# Patient Record
Sex: Female | Born: 1990 | Race: White | Hispanic: No | Marital: Single | Smoking: Never smoker
Health system: Southern US, Community
[De-identification: ages and names within clinical notes are randomized; demographics above are authoritative.]

## PROBLEM LIST (undated history)

## (undated) DIAGNOSIS — F32A Depression, unspecified: Secondary | ICD-10-CM

## (undated) DIAGNOSIS — R162 Hepatomegaly with splenomegaly, not elsewhere classified: Secondary | ICD-10-CM

## (undated) DIAGNOSIS — F419 Anxiety disorder, unspecified: Secondary | ICD-10-CM

## (undated) DIAGNOSIS — F329 Major depressive disorder, single episode, unspecified: Secondary | ICD-10-CM

## (undated) HISTORY — DX: Anxiety disorder, unspecified: F41.9

## (undated) HISTORY — DX: Major depressive disorder, single episode, unspecified: F32.9

## (undated) HISTORY — DX: Hepatomegaly with splenomegaly, not elsewhere classified: R16.2

## (undated) HISTORY — DX: Depression, unspecified: F32.A

---

## 2011-06-22 HISTORY — PX: APPENDECTOMY: SHX54

## 2013-03-21 DIAGNOSIS — R162 Hepatomegaly with splenomegaly, not elsewhere classified: Secondary | ICD-10-CM

## 2013-03-21 DIAGNOSIS — R161 Splenomegaly, not elsewhere classified: Secondary | ICD-10-CM | POA: Insufficient documentation

## 2013-03-21 HISTORY — DX: Hepatomegaly with splenomegaly, not elsewhere classified: R16.2

## 2013-04-16 ENCOUNTER — Ambulatory Visit (INDEPENDENT_AMBULATORY_CARE_PROVIDER_SITE_OTHER): Payer: Managed Care, Other (non HMO) | Admitting: Family Medicine

## 2013-04-16 VITALS — BP 114/83 | HR 102 | Temp 99.0°F | Resp 18 | Ht 66.0 in | Wt 210.0 lb

## 2013-04-16 DIAGNOSIS — R52 Pain, unspecified: Secondary | ICD-10-CM

## 2013-04-16 DIAGNOSIS — R51 Headache: Secondary | ICD-10-CM

## 2013-04-16 DIAGNOSIS — R61 Generalized hyperhidrosis: Secondary | ICD-10-CM

## 2013-04-16 LAB — POCT URINALYSIS DIPSTICK
Nitrite, UA: NEGATIVE
Protein, UA: NEGATIVE
Urobilinogen, UA: 0.2

## 2013-04-16 LAB — POCT CBC
Lymph, poc: 1.9 (ref 0.6–3.4)
MCHC: 32.5 g/dL (ref 31.8–35.4)
MPV: 9 fL (ref 0–99.8)
POC Granulocyte: 2.2 (ref 2–6.9)
POC LYMPH PERCENT: 42.8 %L (ref 10–50)
POC MID %: 8 %M (ref 0–12)
RDW, POC: 13 %

## 2013-04-16 LAB — COMPREHENSIVE METABOLIC PANEL
ALT: 34 U/L (ref 0–35)
CO2: 23 mEq/L (ref 19–32)
Calcium: 9.6 mg/dL (ref 8.4–10.5)
Chloride: 103 mEq/L (ref 96–112)
Creat: 0.87 mg/dL (ref 0.50–1.10)

## 2013-04-16 LAB — POCT UA - MICROSCOPIC ONLY
Casts, Ur, LPF, POC: NEGATIVE
Crystals, Ur, HPF, POC: NEGATIVE
Yeast, UA: NEGATIVE

## 2013-04-16 LAB — TSH: TSH: 4.778 u[IU]/mL — ABNORMAL HIGH (ref 0.350–4.500)

## 2013-04-16 LAB — CK: Total CK: 39 U/L (ref 7–177)

## 2013-04-16 LAB — GLUCOSE, POCT (MANUAL RESULT ENTRY): POC Glucose: 78 mg/dl (ref 70–99)

## 2013-04-16 NOTE — Progress Notes (Addendum)
[address]   [place], [place]  [address]   [phone]  Subjective:    Patient ID: Melanie Crosby, female    DOB: [birth year], 22 y.o.   MRN: [id number]  Chief Complaint  Patient presents with  . Generalized Body Aches    x` 1 week  . Chills   This chart was scribed for Norberto Sorenson, MD by Blanchard Kelch, ED Scribe. The patient was seen in room 14. Patient's care was started at 10:32 AM.   HPI  Melanie Crosby is a 22 y.o. female who presents to office complaining of constant myalgias, worst in shoulders, neck and back that began five days ago. She felt like she was going to become ill with a URI but then she never developed the heent sxs - just persisted with worsening muscle and neck aches and episodes of sweating.  Two days ago she started getting headaches, abdominal pain, lightheaded, chills and became diaphoretic. Her headache yesterday was so severe she was unable to get up or open her eyes without pain - severe photosensitivity. Eventually went away with tylenol.  She has been getting nauseous at night and has lost her appetite since the myalgias began. Abd pain is diffuse, bowels are normal. She has taken her temp at home and highest 99 despite diaphoresis and chills.  She denies fever, chest pain, palpitations, tremors, rash, leg swelling, shortness of breath, cough, rhinorrhea, congestion, vaginal discharge, or changes in urine.   She normally works out using cardio workout videos but has been busy at work in the past week so she has not used them.She may have been around sick contacts at work but she lives alone. She has not traveled recently. Works in a plant.   Past Medical History  Diagnosis Date  . Depression   . Anxiety    No current outpatient prescriptions on file prior to visit.   No current facility-administered medications on file prior to visit.   No allergies on file  Review of Systems  Constitutional: Positive for chills, diaphoresis and appetite change.  Negative for fever.  HENT: Negative for congestion, ear pain, rhinorrhea and sinus pressure.   Eyes: Positive for photophobia.  Respiratory: Negative for cough and shortness of breath.   Cardiovascular: Negative for chest pain, palpitations and leg swelling.  Gastrointestinal: Positive for nausea and abdominal pain. Negative for vomiting, diarrhea, constipation and blood in stool.  Genitourinary: Negative for dysuria, urgency, frequency, hematuria and vaginal discharge.  Musculoskeletal: Positive for back pain, myalgias, neck pain and neck stiffness.  Skin: Negative for rash.  Neurological: Positive for light-headedness and headaches. Negative for tremors.      BP 110/70  Pulse 81  Temp(Src) 99 F (37.2 C) (Oral)  Resp 18  Ht 5\' 6"  (1.676 m)  Wt 210 lb (95.255 kg)  BMI 33.91 kg/m2  SpO2 97%  LMP 04/12/2013 Objective:   Physical Exam  Nursing note and vitals reviewed. Constitutional: She is oriented to person, place, and time. She appears well-developed and well-nourished. No distress.  HENT:  Head: Normocephalic and atraumatic.  Right Ear: Tympanic membrane, external ear and ear canal normal.  Left Ear: Tympanic membrane, external ear and ear canal normal.  Nose: Rhinorrhea present.  Mouth/Throat: Uvula is midline, oropharynx is clear and moist and mucous membranes are normal.  Eyes: EOM are normal.  Neck: Normal range of motion. Neck supple. No tracheal deviation present. No Brudzinski's sign and no Kernig's sign noted. No thyromegaly present.  Cardiovascular: Normal rate, regular  rhythm, S1 normal, S2 normal and normal heart sounds.   No murmur heard. Pulmonary/Chest: Effort normal and breath sounds normal. No respiratory distress. She has no wheezes. She has no rales.  Abdominal: Soft. Bowel sounds are normal. She exhibits no distension. There is tenderness in the right upper quadrant and left upper quadrant. There is no rebound, no guarding, no tenderness at McBurney's point  and negative Murphy's sign.  Question hepatomegaly. Tenderness so severe it caused the patient to tear up during palpation of abdomen.  Musculoskeletal: Normal range of motion.  Lymphadenopathy:    She has cervical adenopathy.       Left cervical: Superficial cervical adenopathy present.       Right: No supraclavicular adenopathy present.       Left: No supraclavicular adenopathy present.  Neurological: She is alert and oriented to person, place, and time.  Reflex Scores:      Patellar reflexes are 2+ on the right side and 2+ on the left side. Skin: Skin is warm. She is diaphoretic.  Psychiatric: She has a normal mood and affect. Her behavior is normal.   Results for orders placed in visit on 04/16/13  POCT CBC      Result Value Range   WBC 4.5 (*) 4.6 - 10.2 K/uL   Lymph, poc 1.9  0.6 - 3.4   POC LYMPH PERCENT 42.8  10 - 50 %L   MID (cbc) 0.4  0 - 0.9   POC MID % 8.0  0 - 12 %M   POC Granulocyte 2.2  2 - 6.9   Granulocyte percent 49.2  37 - 80 %G   RBC 4.55  4.04 - 5.48 M/uL   Hemoglobin 13.6  12.2 - 16.2 g/dL   HCT, POC 16.1  09.6 - 47.9 %   MCV 92.0  80 - 97 fL   MCH, POC 29.9  27 - 31.2 pg   MCHC 32.5  31.8 - 35.4 g/dL   RDW, POC 04.5     Platelet Count, POC 162  142 - 424 K/uL   MPV 9.0  0 - 99.8 fL  GLUCOSE, POCT (MANUAL RESULT ENTRY)      Result Value Range   POC Glucose 78  70 - 99 mg/dl  POCT UA - MICROSCOPIC ONLY      Result Value Range   WBC, Ur, HPF, POC 1-2     RBC, urine, microscopic neg     Bacteria, U Microscopic trace     Mucus, UA neg     Epithelial cells, urine per micros 1-2     Crystals, Ur, HPF, POC neg     Casts, Ur, LPF, POC neg     Yeast, UA neg    POCT URINALYSIS DIPSTICK      Result Value Range   Color, UA bright yellow     Clarity, UA clear     Glucose, UA neg     Bilirubin, UA neg     Ketones, UA neg     Spec Grav, UA 1.020     Blood, UA neg     pH, UA 6.0     Protein, UA neg     Urobilinogen, UA 0.2     Nitrite, UA neg      Leukocytes, UA Trace          Assessment & Plan:  12:01 PM- discussed lab results with patient. Scheduled follow up for 10/29 to discuss labs that have not yet resulted. Recommend symptomatic  treatment of rest and fluids. Patient verbalizes understanding and agrees with treatment plan.  Body aches - Plan: POCT CBC, POCT glucose (manual entry), POCT SEDIMENTATION RATE, POCT UA - Microscopic Only, POCT urinalysis dipstick, TSH, Epstein-Barr virus VCA antibody panel, Comprehensive metabolic panel, CK, CANCELED: POCT glycosylated hemoglobin (Hb A1C)  Headache(784.0)  Night sweat  Unsure of etiology of sxs - suspect viral in origin. Recheck w/ me in 2-3d and advise rest and hygiene during that time.   If any sxs worsen of HA, photophobia, neck stiffness - go to ER immed to r/o meningitis.    I personally performed the services described in this documentation, which was scribed in my presence. The recorded information has been reviewed and considered, and addended by me as needed.  Norberto Sorenson, MD MPH

## 2013-04-17 LAB — EPSTEIN-BARR VIRUS VCA ANTIBODY PANEL
EBV NA IgG: <3 U/mL (ref <18.0)
EBV VCA IgM: 11.8 U/mL (ref <36.0)

## 2013-04-18 ENCOUNTER — Ambulatory Visit (INDEPENDENT_AMBULATORY_CARE_PROVIDER_SITE_OTHER): Payer: Managed Care, Other (non HMO) | Admitting: Family Medicine

## 2013-04-18 VITALS — BP 121/82 | HR 92 | Temp 98.4°F | Resp 16 | Ht 65.75 in | Wt 209.2 lb

## 2013-04-18 DIAGNOSIS — R1011 Right upper quadrant pain: Secondary | ICD-10-CM

## 2013-04-18 LAB — POCT CBC
Granulocyte percent: 55.4 %G (ref 37–80)
HCT, POC: 38 % (ref 37.7–47.9)
MCV: 92.4 fL (ref 80–97)
MPV: 8.4 fL (ref 0–99.8)
POC Granulocyte: 3.9 (ref 2–6.9)
POC LYMPH PERCENT: 39.7 %L (ref 10–50)
POC MID %: 4.9 %M (ref 0–12)
RBC: 4.11 M/uL (ref 4.04–5.48)
RDW, POC: 13.3 %

## 2013-04-18 LAB — POCT URINALYSIS DIPSTICK
Bilirubin, UA: NEGATIVE
Blood, UA: NEGATIVE
Glucose, UA: NEGATIVE
Nitrite, UA: NEGATIVE
Spec Grav, UA: 1.02
Urobilinogen, UA: 0.2

## 2013-04-18 LAB — POCT SEDIMENTATION RATE: POCT SED RATE: 32 mm/hr — AB (ref 0–22)

## 2013-04-18 NOTE — Progress Notes (Signed)
This chart was scribed for Norberto Sorenson, MD by Joaquin Music, ED Scribe. This patient was seen in room Room 10 and the patient's care was started at 5:47 PM  Subjective:    Patient ID: Melanie Crosby, female    DOB: Jul 24, 1990, 22 y.o.   MRN: [id number] Chief Complaint  Patient presents with  . Follow-up    body aches, chills    HPI Melanie Crosby is a 22 y.o. female who presents to the [facility] for F/U visit from 04/16/2013 when she was seen for diffuse myalgias, diaphoresis, and chills. Pt states she has been feeling better but states she has developed a little sore throat. She states she also has R sided abd pain. She states she had a HA but states the HA was not as bad as before. She denies photophobia. She states she has been having myalgias in the neck, shoulders, and throughout her body. She states she felt slightly lightheaded earlier today but states she believes that was due to standing up too fast and passed after just a few seconds. Has been sleeping a little more than normal but energy levels are starting to return. Mother states pt is very "moopy and has low energy levels." Pt states her diaphoresis episodes are "not as bad."   Pt denies fever, diarrhea, skin rashes and vaginal discharge.  Discussed lab findings from last visit with pt and mother of pt. Pt does not have mononucleosis. Pts liver was normal. Pts thyroid level was low (TSH was mildly elev). Reccommended pt to get thyroid re-checked in 2-4 wks after illness.  Mother flew in to be w/ pt during her illness - mother has MS and is on immunosuppressive medication. Pt works in a [employer] and mother is concerned this could be from some chemical exposure though pt denies.  Past Medical History  Diagnosis Date  . Depression   . Anxiety    No current outpatient prescriptions on file prior to visit.   No current facility-administered medications on file prior to visit.   No Known Allergies   Review of Systems   Constitutional: Positive for chills, diaphoresis, activity change, appetite change and fatigue. Negative for fever.  HENT: Positive for sore throat. Negative for congestion, ear discharge, ear pain, mouth sores, nosebleeds, postnasal drip, rhinorrhea, sinus pressure, sneezing, trouble swallowing and voice change.   Eyes: Positive for photophobia. Negative for pain.  Respiratory: Negative for cough and shortness of breath.   Cardiovascular: Negative for chest pain.  Gastrointestinal: Positive for nausea. Negative for vomiting, abdominal pain, diarrhea and constipation.  Genitourinary: Negative for dysuria, frequency, decreased urine volume and vaginal discharge.  Musculoskeletal: Positive for myalgias, neck pain and neck stiffness. Negative for arthralgias, gait problem and joint swelling.  Skin: Negative for rash.  Neurological: Positive for weakness, light-headedness and headaches. Negative for dizziness and syncope.  Hematological: Positive for adenopathy.  Psychiatric/Behavioral: Positive for sleep disturbance.   Triage Vitals:BP 108/74  Pulse 60  Temp(Src) 98.4 F (36.9 C) (Oral)  Resp 16  Ht 5' 5.75" (1.67 m)  Wt 209 lb 3.2 oz (94.892 kg)  BMI 34.02 kg/m2  SpO2 100%  LMP 04/12/2013    Objective:   Physical Exam  Nursing note and vitals reviewed. Constitutional: She is oriented to person, place, and time. She appears well-developed and well-nourished. No distress.  HENT:  Head: Normocephalic and atraumatic.  Right Ear: External ear normal.  Left Ear: External ear normal.  Nose: Nose normal.  Mouth/Throat: Oropharynx is clear and moist.  No oropharyngeal exudate.  Eyes: Conjunctivae are normal. Pupils are equal, round, and reactive to light.  Neck: Normal range of motion. Neck supple. No thyromegaly present.  Tender swollen superficial adenopathy bilaterally, R worse then L. Mild posterior cervical adenopathy.  Cardiovascular: Normal rate, regular rhythm and normal heart  sounds.   No murmur heard. Pulmonary/Chest: Effort normal and breath sounds normal. She has no wheezes.  Abdominal: Soft. Bowel sounds are normal. She exhibits no distension and no mass. There is hepatomegaly. There is generalized tenderness. There is no rebound, no guarding, no CVA tenderness and negative Murphy's sign. No hernia.  Tenderness most in RUQ but is diffuse  Musculoskeletal: Normal range of motion. She exhibits no tenderness.  Lymphadenopathy:       Head (right side): No submandibular, no tonsillar, no preauricular and no posterior auricular adenopathy present.       Head (left side): No submandibular, no tonsillar, no preauricular and no posterior auricular adenopathy present.    She has cervical adenopathy.       Right cervical: Superficial cervical and posterior cervical adenopathy present.       Left cervical: Superficial cervical and posterior cervical adenopathy present.       Right: No supraclavicular adenopathy present.       Left: No supraclavicular adenopathy present.  Neurological: She is alert and oriented to person, place, and time. She has normal reflexes.  Skin: Skin is warm and dry. No rash noted. She is not diaphoretic.  Psychiatric: She has a normal mood and affect. Her behavior is normal.    Results for orders placed in visit on 04/18/13  POCT CBC      Result Value Range   WBC 7.0  4.6 - 10.2 K/uL   Lymph, poc 2.8  0.6 - 3.4   POC LYMPH PERCENT 39.7  10 - 50 %L   MID (cbc) 0.3  0 - 0.9   POC MID % 4.9  0 - 12 %M   POC Granulocyte 3.9  2 - 6.9   Granulocyte percent 55.4  37 - 80 %G   RBC 4.11  4.04 - 5.48 M/uL   Hemoglobin 12.0 (*) 12.2 - 16.2 g/dL   HCT, POC 11.9  14.7 - 47.9 %   MCV 92.4  80 - 97 fL   MCH, POC 29.2  27 - 31.2 pg   MCHC 31.6 (*) 31.8 - 35.4 g/dL   RDW, POC 82.9     Platelet Count, POC 200  142 - 424 K/uL   MPV 8.4  0 - 99.8 fL  POCT URINALYSIS DIPSTICK      Result Value Range   Color, UA yellow     Clarity, UA clear      Glucose, UA neg     Bilirubin, UA trace     Ketones, UA 1.020     Spec Grav, UA 1.020     Blood, UA neg     pH, UA 5.0     Protein, UA neg     Urobilinogen, UA 0.2     Nitrite, UA neg     Leukocytes, UA Trace     6:43 PM-Discussed lab findings from this visit and prior visit. Informed pt she will have a RUQ X-Ray. Informed pt all her labs results are within normal limits. Mother states she has MS and has been concerned about the health of her daughter. Mother states she has been doing an excessive amount of cleaning.  Assessment & Plan:  Abdominal pain, right upper quadrant - Plan: POCT CBC, POCT SEDIMENTATION RATE, POCT urinalysis dipstick, TSH, Cytomegalovirus antibody, IgG, US Abdomen Limited RUQ, Comprehensive metabolic panel, US Abdomen Complete I suspect that pt has an unknown viral illness that is causing some hepatomegaly and liver tenderness. Will order stat RUQ [place] for tomorrow morning and touch base w/ pt again tomorrow after [place] and blood results have returned.  Ok to RTW as fatigue and sxs allow and but recommend hand hygeine - don't cough/sneeze on anyone. No orders of the defined types were placed in this encounter.    I personally performed the services described in this documentation, which was scribed in my presence. The recorded information has been reviewed and considered, and addended by me as needed.  Norberto Sorenson, MD MPH  Addendum: US revealed hepatomegaly and splenomegaly with RUQ tenderness but no [place] Murphy's sign or gallstones. Poss fatty liver. LFTs continued to be nml w/ nml cbc and decreasing ESR. Advised pt to follow clinically - seems like she is improving from whatever this was - likely unknown virus. Rec repeat abd [place] in 4-6 wks to ensure organomegaly has resolved. RTC is sxs cont or worsen.

## 2013-04-19 ENCOUNTER — Ambulatory Visit
Admission: RE | Admit: 2013-04-19 | Discharge: 2013-04-19 | Disposition: A | Discharge status: Home / Self Care | Payer: Managed Care, Other (non HMO) | Source: Ambulatory Visit | Attending: Family Medicine | Admitting: Family Medicine

## 2013-04-19 ENCOUNTER — Telehealth: Payer: Self-pay

## 2013-04-19 ENCOUNTER — Other Ambulatory Visit: Payer: Self-pay

## 2013-04-19 DIAGNOSIS — R1011 Right upper quadrant pain: Secondary | ICD-10-CM

## 2013-04-19 DIAGNOSIS — B349 Viral infection, unspecified: Secondary | ICD-10-CM

## 2013-04-19 DIAGNOSIS — K760 Fatty (change of) liver, not elsewhere classified: Secondary | ICD-10-CM

## 2013-04-19 DIAGNOSIS — R162 Hepatomegaly with splenomegaly, not elsewhere classified: Secondary | ICD-10-CM

## 2013-04-19 LAB — COMPREHENSIVE METABOLIC PANEL
ALT: 34 U/L (ref 0–35)
AST: 34 U/L (ref 0–37)
Calcium: 9.4 mg/dL (ref 8.4–10.5)
Chloride: 104 mEq/L (ref 96–112)
Creat: 0.81 mg/dL (ref 0.50–1.10)
Sodium: 139 mEq/L (ref 135–145)
Total Bilirubin: 0.3 mg/dL (ref 0.3–1.2)
Total Protein: 7.4 g/dL (ref 6.0–8.3)

## 2013-04-19 LAB — TSH: TSH: 2.118 u[IU]/mL (ref 0.350–4.500)

## 2013-04-19 NOTE — Telephone Encounter (Signed)
Patient is calling amy back please call [phone]

## 2013-04-19 NOTE — Telephone Encounter (Signed)
Had called about the [place], this is done. IMPRESSION:  1. No gallstones. No pain over the gallbladder is seen although  there is tenderness over the right upper quadrant.  2. Hepatosplenomegaly.  3. Slightly inhomogeneous liver. Question mild fatty infiltration.  Correlate with liver function tests.  4. Portions of the pancreas are obscured by bowel gas.

## 2013-04-21 LAB — CYTOMEGALOVIRUS ANTIBODY, IGG: Cytomegalovirus Ab-IgG: 0.36 U/mL (ref <0.60)

## 2013-04-23 ENCOUNTER — Telehealth: Payer: Self-pay

## 2013-04-23 NOTE — Telephone Encounter (Signed)
PT STATES HER JOB NEED TO HAVE A NOTE STATING WHAT SHE CAN AND CANNOT DO AND IF ON MODIFIED DUTY. YOU MAY REACH PT AT [phone] AND PLEASE CALL [phone] AND ASK FOR Loletha Carrow

## 2013-04-23 NOTE — Telephone Encounter (Signed)
Called pt w/ [place] results on 10/30 afternoon.  Suspect viral induced hepato- and splenomegaly.  Pt symptomatically improving and LFTs con to be normal so cont w/ watchful waiting and recheck abd [place] in 4-6 wks.

## 2013-04-23 NOTE — Telephone Encounter (Signed)
Please advise on the patients work restrictions.

## 2013-04-24 NOTE — Telephone Encounter (Signed)
Left message for her to call me back. 

## 2013-04-24 NOTE — Telephone Encounter (Signed)
Per my OV note "Ok to RTW as fatigue and sxs allow and but recommend hand hygeine - don't cough/sneeze on anyone."  I guess no heavy lifting over 25 lbs.  No contact sports.  If pt is still feeling fatigue and wants her hours limited to 4 or 6 hrs/day this week, that is fine with me.

## 2013-04-26 NOTE — Telephone Encounter (Signed)
Called again numbers given are not correct.

## 2013-05-25 ENCOUNTER — Ambulatory Visit
Admission: RE | Admit: 2013-05-25 | Discharge: 2013-05-25 | Disposition: A | Discharge status: Home / Self Care | Payer: Managed Care, Other (non HMO) | Source: Ambulatory Visit | Attending: Family Medicine | Admitting: Family Medicine

## 2013-05-25 DIAGNOSIS — K760 Fatty (change of) liver, not elsewhere classified: Secondary | ICD-10-CM

## 2013-05-25 DIAGNOSIS — B349 Viral infection, unspecified: Secondary | ICD-10-CM

## 2013-05-25 DIAGNOSIS — R162 Hepatomegaly with splenomegaly, not elsewhere classified: Secondary | ICD-10-CM

## 2013-05-28 ENCOUNTER — Other Ambulatory Visit: Payer: Self-pay | Admitting: Family Medicine

## 2013-05-28 DIAGNOSIS — R160 Hepatomegaly, not elsewhere classified: Secondary | ICD-10-CM

## 2013-05-29 ENCOUNTER — Encounter: Payer: Self-pay | Admitting: Internal Medicine

## 2013-06-01 ENCOUNTER — Telehealth: Payer: Self-pay

## 2013-06-01 NOTE — Telephone Encounter (Signed)
Left message for patient. Request received and records sent to Dr Dulce Sellar at [facility]

## 2013-06-01 NOTE — Telephone Encounter (Signed)
Patient is calling to see if we received her release form she faxed it yesterday please call [phone]

## 2013-06-26 ENCOUNTER — Ambulatory Visit: Payer: Managed Care, Other (non HMO) | Admitting: Physician Assistant

## 2013-06-26 VITALS — BP 118/70 | HR 106 | Temp 100.5°F | Resp 16 | Ht 66.5 in | Wt 212.6 lb

## 2013-06-26 DIAGNOSIS — J111 Influenza due to unidentified influenza virus with other respiratory manifestations: Secondary | ICD-10-CM

## 2013-06-26 DIAGNOSIS — R059 Cough, unspecified: Secondary | ICD-10-CM

## 2013-06-26 DIAGNOSIS — J101 Influenza due to other identified influenza virus with other respiratory manifestations: Secondary | ICD-10-CM

## 2013-06-26 DIAGNOSIS — R509 Fever, unspecified: Secondary | ICD-10-CM

## 2013-06-26 DIAGNOSIS — K7689 Other specified diseases of liver: Secondary | ICD-10-CM

## 2013-06-26 DIAGNOSIS — R162 Hepatomegaly with splenomegaly, not elsewhere classified: Secondary | ICD-10-CM

## 2013-06-26 DIAGNOSIS — R05 Cough: Secondary | ICD-10-CM

## 2013-06-26 LAB — POCT CBC
GRANULOCYTE PERCENT: 78.2 % (ref 37–80)
HEMATOCRIT: 42.1 % (ref 37.7–47.9)
HEMOGLOBIN: 13.2 g/dL (ref 12.2–16.2)
Lymph, poc: 0.8 (ref 0.6–3.4)
MCH: 28.8 pg (ref 27–31.2)
MCHC: 31.4 g/dL — AB (ref 31.8–35.4)
MCV: 91.7 fL (ref 80–97)
MID (CBC): 0.6 (ref 0–0.9)
MPV: 9.4 fL (ref 0–99.8)
POC Granulocyte: 5 (ref 2–6.9)
POC LYMPH PERCENT: 13.1 %L (ref 10–50)
POC MID %: 8.7 %M (ref 0–12)
Platelet Count, POC: 181 10*3/uL (ref 142–424)
RBC: 4.59 M/uL (ref 4.04–5.48)
RDW, POC: 13.2 %
WBC: 6.4 10*3/uL (ref 4.6–10.2)

## 2013-06-26 LAB — POCT INFLUENZA A/B
INFLUENZA B, POC: NEGATIVE
Influenza A, POC: POSITIVE

## 2013-06-26 MED ORDER — BENZONATATE 100 MG PO CAPS: 100.0000 mg | ORAL_CAPSULE | Freq: Three times a day (TID) | ORAL | Status: DC | PRN

## 2013-06-26 MED ORDER — OSELTAMIVIR PHOSPHATE 75 MG PO CAPS: 75.0000 mg | ORAL_CAPSULE | Freq: Two times a day (BID) | ORAL | Status: DC

## 2013-06-26 MED ORDER — HYDROCODONE-HOMATROPINE 5-1.5 MG/5ML PO SYRP: 5.0000 mL | ORAL_SOLUTION | Freq: Three times a day (TID) | ORAL | Status: DC | PRN

## 2013-06-26 NOTE — Progress Notes (Signed)
Subjective:    Patient ID: Darrick PennaKayla Drouillard, female    DOB: 1991/03/23, 23 y.o.   MRN: [id number]  HPI   [name] is a very pleasant 23 yr old female here with concern for illness.  "My body feels like it got hit by a bus."  Feels sore all over.  Really hot then really cold.  Tmax 100.84F.  "Coughing is awful."  Symptoms began about 48 hours ago.  +sick coworkers.  No flu shot this year.  Of note pt with hepatosplenomegaly first noted her in Oct 2014.  LFTs normal.  U/S with HSM but otherwise normal.  Pt saw a GI doc (not the one she was referred to) and was told she's "chasing something that's not there."  She continues to have intermittent RUQ and occ LUQ pain.  No nausea, vomiting, diarrhea.  GI recommended repeat U/S in April 2014   Review of Systems  Constitutional: Positive for fever and chills.  HENT: Negative for congestion and sore throat.   Respiratory: Positive for cough. Negative for shortness of breath and wheezing.   Cardiovascular: Negative.   Gastrointestinal: Positive for abdominal pain (RUQ, LUQ). Negative for nausea, vomiting and diarrhea.  Musculoskeletal: Positive for arthralgias and myalgias.  Skin: Negative.   Neurological: Negative.        Objective:   Physical Exam  Vitals reviewed. Constitutional: She is oriented to person, place, and time. She appears well-developed and well-nourished. No distress.  HENT:  Head: Normocephalic and atraumatic.  Right Ear: Tympanic membrane and ear canal normal.  Left Ear: Tympanic membrane and ear canal normal.  Mouth/Throat: Uvula is midline, oropharynx is clear and moist and mucous membranes are normal.  Eyes: Conjunctivae are normal. No scleral icterus.  Neck: Neck supple.  Cardiovascular: Normal rate, regular rhythm and normal heart sounds.   Pulmonary/Chest: Effort normal and breath sounds normal. She has no wheezes. She has no rales.  Abdominal: Soft. Bowel sounds are normal. Hepatomegaly: ? difficult to appreciate  due to guarding. There is tenderness in the right upper quadrant and left upper quadrant. There is guarding. There is no rigidity and no rebound.  Lymphadenopathy:    She has cervical adenopathy.  Neurological: She is alert and oriented to person, place, and time.  Skin: Skin is warm and dry.  Psychiatric: She has a normal mood and affect. Her behavior is normal.    Results for orders placed in visit on 06/26/13  POCT CBC      Result Value Range   WBC 6.4  4.6 - 10.2 K/uL   Lymph, poc 0.8  0.6 - 3.4   POC LYMPH PERCENT 13.1  10 - 50 %L   MID (cbc) 0.6  0 - 0.9   POC MID % 8.7  0 - 12 %M   POC Granulocyte 5.0  2 - 6.9   Granulocyte percent 78.2  37 - 80 %G   RBC 4.59  4.04 - 5.48 M/uL   Hemoglobin 13.2  12.2 - 16.2 g/dL   HCT, POC 42.542.1  95.637.7 - 47.9 %   MCV 91.7  80 - 97 fL   MCH, POC 28.8  27 - 31.2 pg   MCHC 31.4 (*) 31.8 - 35.4 g/dL   RDW, POC 38.713.2     Platelet Count, POC 181  142 - 424 K/uL   MPV 9.4  0 - 99.8 fL  POCT INFLUENZA A/B      Result Value Range   Influenza A, POC Positive  Influenza B, POC Negative         Assessment & Plan:  Influenza A - Plan: oseltamivir (TAMIFLU) 75 MG capsule  Fever, unspecified - Plan: POCT CBC, POCT Influenza A/B  Cough - Plan: POCT CBC, POCT Influenza A/B, HYDROcodone-homatropine (HYCODAN) 5-1.5 MG/5ML syrup, benzonatate (TESSALON) 100 MG capsule  Hepatosplenomegaly - Plan: Comprehensive metabolic panel   Ms. Holaday is a very pleasant 23 yr old female here with influenza A.  Symptoms started about 48 hours ago, so may still get some benefit from tamiflu.  Tessalon and Hycodan for cough.  Push fluids, rest.  OOW until fever free.  Pt still with questionable HSM on exam though difficult to appreciate due to guarding.  Pt admits that she is guarding because she is afraid of potential discomfort.  She has no concerning symptoms such as NVD, jaundice, wt loss, etc.  Last LFTs were normal.  Will recheck today.  Agree with GI rec to  repeat U/S in April.  Pt may be interested in ref to another GI as she did not have a pleasant experience with the person she previously saw  E. Frances Furbish MHS, PA-C [facility] 1/6/20155:42 PM

## 2013-06-26 NOTE — Patient Instructions (Signed)
Begin taking the tamiflu tonight.  Be sure to finish the full course.  Use the Tessalon Perles for cough during the day.  Hycodan syrup for cough at night - will make you sleepy!  Drink plenty of fluids and get plenty of rest.  Use Advil and/or Tylenol for fever relief, body aches  Stay out of work until you are fever-free for 24 hours without medication  Flu shot next year :)  I will let you know when your metabolic panel is back.   Influenza, Adult Influenza ("the flu") is a viral infection of the respiratory tract. It occurs more often in winter months because people spend more time in close contact with one another. Influenza can make you feel very sick. Influenza easily spreads from person to person (contagious). CAUSES  Influenza is caused by a virus that infects the respiratory tract. You can catch the virus by breathing in droplets from an infected person's cough or sneeze. You can also catch the virus by touching something that was recently contaminated with the virus and then touching your mouth, nose, or eyes. SYMPTOMS  Symptoms typically last 4 to 10 days and may include:  Fever.  Chills.  Headache, body aches, and muscle aches.  Sore throat.  Chest discomfort and cough.  Poor appetite.  Weakness or feeling tired.  Dizziness.  Nausea or vomiting. DIAGNOSIS  Diagnosis of influenza is often made based on your history and a physical exam. A nose or throat swab test can be done to confirm the diagnosis. RISKS AND COMPLICATIONS You may be at risk for a more severe case of influenza if you smoke cigarettes, have diabetes, have chronic heart disease (such as heart failure) or lung disease (such as asthma), or if you have a weakened immune system. Elderly people and pregnant women are also at risk for more serious infections. The most common complication of influenza is a lung infection (pneumonia). Sometimes, this complication can require emergency medical care and may be  life-threatening. PREVENTION  An annual influenza vaccination (flu shot) is the best way to avoid getting influenza. An annual flu shot is now routinely recommended for all adults in the [place] TREATMENT  In mild cases, influenza goes away on its own. Treatment is directed at relieving symptoms. For more severe cases, your caregiver may prescribe antiviral medicines to shorten the sickness. Antibiotic medicines are not effective, because the infection is caused by a virus, not by bacteria. HOME CARE INSTRUCTIONS  Only take over-the-counter or prescription medicines for pain, discomfort, or fever as directed by your caregiver.  Use a cool mist humidifier to make breathing easier.  Get plenty of rest until your temperature returns to normal. This usually takes 3 to 4 days.  Drink enough fluids to keep your urine clear or pale yellow.  Cover your mouth and nose when coughing or sneezing, and wash your hands well to avoid spreading the virus.  Stay home from work or school until your fever has been gone for at least 1 full day. SEEK MEDICAL CARE IF:   You have chest pain or a deep cough that worsens or produces more mucus.  You have nausea, vomiting, or diarrhea. SEEK IMMEDIATE MEDICAL CARE IF:   You have difficulty breathing, shortness of breath, or your skin or nails turn bluish.  You have severe neck pain or stiffness.  You have a severe headache, facial pain, or earache.  You have a worsening or recurring fever.  You have nausea or vomiting that cannot  be controlled. MAKE SURE YOU:  Understand these instructions.  Will watch your condition.  Will get help right away if you are not doing well or get worse. Document Released: 06/04/2000 Document Revised: 12/07/2011 Document Reviewed: 09/06/2011 [facility] Patient Information 2014 [employer], [place].

## 2013-06-27 ENCOUNTER — Ambulatory Visit: Payer: Managed Care, Other (non HMO) | Admitting: Internal Medicine

## 2013-06-27 LAB — COMPREHENSIVE METABOLIC PANEL
ALK PHOS: 58 U/L (ref 39–117)
ALT: 14 U/L (ref 0–35)
AST: 20 U/L (ref 0–37)
Albumin: 4.6 g/dL (ref 3.5–5.2)
BILIRUBIN TOTAL: 0.3 mg/dL (ref 0.3–1.2)
BUN: 11 mg/dL (ref 6–23)
CO2: 23 mEq/L (ref 19–32)
CREATININE: 0.7 mg/dL (ref 0.50–1.10)
Calcium: 9.3 mg/dL (ref 8.4–10.5)
Chloride: 102 mEq/L (ref 96–112)
GLUCOSE: 84 mg/dL (ref 70–99)
Potassium: 4.1 mEq/L (ref 3.5–5.3)
Sodium: 136 mEq/L (ref 135–145)
TOTAL PROTEIN: 7.9 g/dL (ref 6.0–8.3)

## 2013-07-03 ENCOUNTER — Telehealth: Payer: Self-pay | Admitting: *Deleted

## 2013-07-03 NOTE — Telephone Encounter (Signed)
Tinika from [facility] called to obtain last office visit notes including US report.  Please fax to [phone]

## 2013-07-09 DIAGNOSIS — E669 Obesity, unspecified: Secondary | ICD-10-CM | POA: Insufficient documentation

## 2013-07-09 DIAGNOSIS — F329 Major depressive disorder, single episode, unspecified: Secondary | ICD-10-CM | POA: Insufficient documentation

## 2013-07-09 DIAGNOSIS — F32A Depression, unspecified: Secondary | ICD-10-CM | POA: Insufficient documentation

## 2013-08-28 ENCOUNTER — Other Ambulatory Visit: Payer: Self-pay | Admitting: Gastroenterology

## 2013-08-28 DIAGNOSIS — R160 Hepatomegaly, not elsewhere classified: Secondary | ICD-10-CM

## 2013-10-01 ENCOUNTER — Ambulatory Visit
Admission: RE | Admit: 2013-10-01 | Discharge: 2013-10-01 | Disposition: A | Discharge status: Home / Self Care | Payer: Managed Care, Other (non HMO) | Source: Ambulatory Visit | Attending: Gastroenterology | Admitting: Gastroenterology

## 2013-10-01 DIAGNOSIS — R160 Hepatomegaly, not elsewhere classified: Secondary | ICD-10-CM

## 2013-10-03 ENCOUNTER — Telehealth: Payer: Self-pay | Admitting: Oncology

## 2013-10-03 NOTE — Telephone Encounter (Signed)
S/W PATIENT AND GAVE NEW PATIENT APPT FOR 04/29 @ 10:30 W/DR. SHADAD.  REFERRING DR. Chrissie NoaWILLIAM OUTLAW DX- HEPATOSPLENOMEGALEY-UNCLEAR CAUSE WELCOME PACKET MAILED.

## 2013-10-04 ENCOUNTER — Telehealth: Payer: Self-pay | Admitting: Oncology

## 2013-10-04 NOTE — Telephone Encounter (Signed)
C/D 10/04/13 for appt.10/23/13

## 2013-10-19 ENCOUNTER — Other Ambulatory Visit: Payer: Self-pay | Admitting: Oncology

## 2013-10-19 DIAGNOSIS — R160 Hepatomegaly, not elsewhere classified: Secondary | ICD-10-CM

## 2013-10-22 ENCOUNTER — Telehealth: Payer: Self-pay | Admitting: Medical Oncology

## 2013-10-22 NOTE — Telephone Encounter (Signed)
LVMOM with patient regarding tomorrow's appt. Informed patient of free valet parking and requested pt bring a list of her current medications. Requested patient return call to office to confirm appt.  

## 2013-10-23 ENCOUNTER — Other Ambulatory Visit (HOSPITAL_BASED_OUTPATIENT_CLINIC_OR_DEPARTMENT_OTHER): Payer: Managed Care, Other (non HMO)

## 2013-10-23 ENCOUNTER — Ambulatory Visit (HOSPITAL_BASED_OUTPATIENT_CLINIC_OR_DEPARTMENT_OTHER): Payer: Managed Care, Other (non HMO) | Admitting: Oncology

## 2013-10-23 ENCOUNTER — Ambulatory Visit: Payer: Managed Care, Other (non HMO)

## 2013-10-23 ENCOUNTER — Encounter: Payer: Self-pay | Admitting: Oncology

## 2013-10-23 ENCOUNTER — Encounter (INDEPENDENT_AMBULATORY_CARE_PROVIDER_SITE_OTHER): Payer: Self-pay

## 2013-10-23 VITALS — BP 130/69 | HR 77 | Temp 98.2°F | Resp 20 | Ht 66.5 in | Wt 203.1 lb

## 2013-10-23 DIAGNOSIS — K7689 Other specified diseases of liver: Secondary | ICD-10-CM

## 2013-10-23 DIAGNOSIS — R161 Splenomegaly, not elsewhere classified: Secondary | ICD-10-CM

## 2013-10-23 DIAGNOSIS — R160 Hepatomegaly, not elsewhere classified: Secondary | ICD-10-CM

## 2013-10-23 LAB — CBC WITH DIFFERENTIAL/PLATELET
BASO%: 0.3 % (ref 0.0–2.0)
BASOS ABS: 0 10*3/uL (ref 0.0–0.1)
EOS%: 0.6 % (ref 0.0–7.0)
Eosinophils Absolute: 0 10*3/uL (ref 0.0–0.5)
HEMATOCRIT: 38.6 % (ref 34.8–46.6)
HEMOGLOBIN: 12.9 g/dL (ref 11.6–15.9)
LYMPH#: 1.5 10*3/uL (ref 0.9–3.3)
LYMPH%: 19.5 % (ref 14.0–49.7)
MCH: 29.3 pg (ref 25.1–34.0)
MCHC: 33.3 g/dL (ref 31.5–36.0)
MCV: 88 fL (ref 79.5–101.0)
MONO#: 0.5 10*3/uL (ref 0.1–0.9)
MONO%: 6.7 % (ref 0.0–14.0)
NEUT%: 72.9 % (ref 38.4–76.8)
NEUTROS ABS: 5.6 10*3/uL (ref 1.5–6.5)
Platelets: 212 10*3/uL (ref 145–400)
RBC: 4.38 10*6/uL (ref 3.70–5.45)
RDW: 13 % (ref 11.2–14.5)
WBC: 7.7 10*3/uL (ref 3.9–10.3)

## 2013-10-23 LAB — COMPREHENSIVE METABOLIC PANEL (CC13)
ALT: 12 U/L (ref 0–55)
AST: 17 U/L (ref 5–34)
Albumin: 3.9 g/dL (ref 3.5–5.0)
Alkaline Phosphatase: 62 U/L (ref 40–150)
Anion Gap: 8 mEq/L (ref 3–11)
BUN: 13.5 mg/dL (ref 7.0–26.0)
CALCIUM: 9.8 mg/dL (ref 8.4–10.4)
CHLORIDE: 109 meq/L (ref 98–109)
CO2: 25 mEq/L (ref 22–29)
Creatinine: 0.9 mg/dL (ref 0.6–1.1)
GLUCOSE: 83 mg/dL (ref 70–140)
Potassium: 3.9 mEq/L (ref 3.5–5.1)
Sodium: 142 mEq/L (ref 136–145)
Total Bilirubin: 0.33 mg/dL (ref 0.20–1.20)
Total Protein: 7.5 g/dL (ref 6.4–8.3)

## 2013-10-23 LAB — CHCC SMEAR

## 2013-10-23 NOTE — Progress Notes (Signed)
Please see consult note.  

## 2013-10-23 NOTE — Consult Note (Signed)
Reason for Referral: Hepatosplenomegaly.   HPI: This is a pleasant 23 year old  female native of [place] but currently lives in [place] for the last year or so. She was in her usual state of health still she presented to to her PCP on 04/16/2013 with diffuse myalgias, diaphoresis, and chills. She has developed a little sore throat. She states she also has R sided abd pain. She denies photophobia. She states she has been having myalgias in the neck, shoulders, and throughout her body. Has been sleeping a little more than normal but energy levels are starting to return. She underwent an abdominal ultrasound on 04/19/2013 which showed that her liver is prominent at 17.6 cm and her spleen is slightly enlarged at 14.1 cm. The splenic volume is at 600 cc. Her liver function tests among other laboratory testing did not reveal any abnormalities. Repeat imaging studies including an ultrasound on 05/25/2013 showed predominantly stable findings. This was also repeated on 10/01/2013 which showed the splenic volume is stable at 626 cc and the splenic length is 13 cm. She was evaluated by gastroenterology as well as in December 2014 which felt that her splenic enlargement is related to fatty infiltration and possible postinfectious. Patient referred to me for an evaluation at this time.   Clinically, she feels relatively normal. He does report some occasional fatigue and tiredness. But she has not reported any recent fevers or chills or sweats. She has reported weight loss but that is intentional. She has continued at times reports some tenderness in the right upper and left upper quadrants. She has not reported any nausea or vomiting but does report some abdominal fullness. Has not reported any hematochezia or melena. Has not reported any lymphadenopathy or any constitutional symptoms. Has not reported any petechiae or bruising. 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 She has not reported any frequency  urgency or hesitancy. Has not reported any neurological symptoms of seizures alteration of mental status. She is not reporting any chest pain palpitation orthopnea or PND. She has not reported any cough or wheezing or hemoptysis.   Past Medical History  Diagnosis Date  . Depression   . Anxiety   :  Past Surgical History  Procedure Laterality Date  . Appendectomy    :  No current outpatient prescriptions on file.:  No Known Allergies:  Family History  Problem Relation Age of Onset  . Multiple sclerosis Mother   . Heart disease Maternal Grandfather   :  History   Social History  . Marital Status: Single    Spouse Name: N/A    Number of Children: N/A  . Years of Education: N/A   Occupational History  . Not on file.   Social History Main Topics  . Smoking status: Never Smoker   . Smokeless tobacco: Not on file  . Alcohol Use: Not on file  . Drug Use: Not on file  . Sexual Activity: Not on file   Other Topics Concern  . Not on file   Social History Narrative  . No narrative on file  :  Pertinent items are noted in HPI.  Exam: Blood pressure 130/69, pulse 77, temperature 98.2 F (36.8 C), temperature source Oral, resp. rate 20, height 5' 6.5" (1.689 m), weight 203 lb 1.6 oz (92.126 kg), SpO2 100.00%. General appearance: alert, cooperative and appears stated age Head: Normocephalic, without obvious abnormality, atraumatic Throat: lips, mucosa, and tongue normal; teeth and gums normal Neck: no adenopathy, no carotid bruit, no JVD,  supple, symmetrical, trachea midline and thyroid not enlarged, symmetric, no tenderness/mass/nodules Back: symmetric, no curvature. ROM normal. No CVA tenderness. Resp: clear to auscultation bilaterally Chest wall: no tenderness Cardio: regular rate and rhythm, S1, S2 normal, no murmur, click, rub or gallop GI: soft, nontender but guarding was noted. Tip of the spleen was palpated but no lymphadenopathy. Good bowel sounds. Extremities:  extremities normal, atraumatic, no cyanosis or edema Pulses: 2+ and symmetric Skin: Skin color, texture, turgor normal. No rashes or lesions Lymph nodes: Cervical, supraclavicular, and axillary nodes normal.   Recent Labs  10/23/13 1043  WBC 7.7  HGB 12.9  HCT 38.6  PLT 212    Recent Labs  10/23/13 1043  NA 142  K 3.9  CO2 25  GLUCOSE 83  BUN 13.5  CREATININE 0.9  CALCIUM 9.8      US Abdomen Complete  10/01/2013   CLINICAL DATA:  Hepatosplenomegaly  EXAM: ULTRASOUND ABDOMEN COMPLETE  COMPARISON:  05/25/2013  FINDINGS: Gallbladder:  No gallstones or wall thickening visualized. No sonographic Murphy sign noted.  Common bile duct:  Diameter: 4 mm  Liver:  No focal lesion identified. Within normal limits in parenchymal echogenicity. Patent portal vein with normal hepatopetal flow. Similar size measuring 18 cm in craniocaudal length.  IVC:  No abnormality visualized.  Pancreas:  Visualized portion unremarkable.  Spleen:  Normal homogeneous echogenicity. No focal abnormality. Splenic length is 13 cm. Splenic volume is estimated at 626 cc.  Right Kidney:  Length: 10.7 cm. Echogenicity within normal limits. No mass or hydronephrosis visualized.  Left Kidney:  Length: 11.77. Echogenicity within normal limits. No mass or hydronephrosis visualized.  Abdominal aorta:  No aneurysm visualized.  Other findings:  None.  IMPRESSION: Stable mild hepatosplenomegaly.  No acute finding by ultrasound.   Electronically Signed   By: Daryll Brod M.D.   On: 10/01/2013 08:42    Assessment and Plan:   23 year old woman with mild hepatosplenomegaly. The differential diagnosis was discussed with the patient today. Hematological disorder such as myeloproliferative disorder, lymphoproliferative disorder such as lymphoma or any other bone marrow diseases are extremely unlikely. She does not have any other signs of symptoms in her laboratory data and peripheral smear was perfectly normal. This most likely  repositioned to be active finding such as postinfectious or viral illnesses. I anticipate these will improve we'll for period of time. My recommendation is for her to continue observation and surveillance. I recommend repeat imaging studies in another 6 months and repeat laboratory testing around the same time. If she develops any hematological abnormalities or increase in her splenic enlargement, we will reevaluate her at that time. I discussed with her other studies such as a bone marrow biopsy or a PET/CT scan but none of these are indicated at this time but certainly we willconsider them in the future should she develops any signs of symptoms to suggest a hematological disorder.  I will be happy to evaluate her in the future if felt needed in the future.

## 2013-10-23 NOTE — Progress Notes (Signed)
Checked in new patient with no financial issues. She has appt card and has not been out of country °

## 2013-11-12 ENCOUNTER — Ambulatory Visit: Payer: Managed Care, Other (non HMO) | Admitting: Physician Assistant

## 2013-11-12 VITALS — BP 122/82 | HR 80 | Temp 99.3°F | Resp 16 | Ht 66.0 in | Wt 199.4 lb

## 2013-11-12 DIAGNOSIS — R5381 Other malaise: Secondary | ICD-10-CM

## 2013-11-12 DIAGNOSIS — R509 Fever, unspecified: Secondary | ICD-10-CM

## 2013-11-12 DIAGNOSIS — R5383 Other fatigue: Secondary | ICD-10-CM

## 2013-11-12 DIAGNOSIS — B2790 Infectious mononucleosis, unspecified without complication: Secondary | ICD-10-CM

## 2013-11-12 DIAGNOSIS — J029 Acute pharyngitis, unspecified: Secondary | ICD-10-CM

## 2013-11-12 LAB — COMPREHENSIVE METABOLIC PANEL
ALBUMIN: 4.2 g/dL (ref 3.5–5.2)
ALT: 324 U/L — AB (ref 0–35)
AST: 264 U/L — ABNORMAL HIGH (ref 0–37)
Alkaline Phosphatase: 263 U/L — ABNORMAL HIGH (ref 39–117)
BILIRUBIN TOTAL: 0.4 mg/dL (ref 0.2–1.2)
BUN: 12 mg/dL (ref 6–23)
CO2: 23 mEq/L (ref 19–32)
Calcium: 9.1 mg/dL (ref 8.4–10.5)
Chloride: 102 mEq/L (ref 96–112)
Creat: 0.95 mg/dL (ref 0.50–1.10)
GLUCOSE: 85 mg/dL (ref 70–99)
Potassium: 4.4 mEq/L (ref 3.5–5.3)
SODIUM: 136 meq/L (ref 135–145)
TOTAL PROTEIN: 7.4 g/dL (ref 6.0–8.3)

## 2013-11-12 LAB — POCT CBC
Granulocyte percent: 38.1 %G (ref 37–80)
HEMATOCRIT: 38.2 % (ref 37.7–47.9)
HEMOGLOBIN: 12.2 g/dL (ref 12.2–16.2)
Lymph, poc: 2.8 (ref 0.6–3.4)
MCH: 28.6 pg (ref 27–31.2)
MCHC: 31.9 g/dL (ref 31.8–35.4)
MCV: 89.4 fL (ref 80–97)
MID (cbc): 0.7 (ref 0–0.9)
MPV: 9.2 fL (ref 0–99.8)
POC Granulocyte: 2.2 (ref 2–6.9)
POC LYMPH PERCENT: 50 %L (ref 10–50)
POC MID %: 11.9 % (ref 0–12)
Platelet Count, POC: 110 10*3/uL — AB (ref 142–424)
RBC: 4.27 M/uL (ref 4.04–5.48)
RDW, POC: 13.6 %
WBC: 5.7 10*3/uL (ref 4.6–10.2)

## 2013-11-12 LAB — POCT RAPID STREP A (OFFICE): Rapid Strep A Screen: NEGATIVE

## 2013-11-12 NOTE — Progress Notes (Signed)
Subjective:    Patient ID: Darrick PennaKayla Dattilio, female    DOB: 06/21/1991, 23 y.o.   MRN: [id number]  HPI Primary Physician: Iona HansenJones, Penny L, NP  Chief Complaint: Sore throat x 1 day and fever x 1 week  HPI: 23 y.o. female with history below presents with fever x 1 week and sore throat x 1 day. T max 99.5. Noticed "white spots" on the right side of her throat the previous day prompting her to come in for evaluation today. Headache along the frontal region rated a 4 or 5/10. Some fatigue. No congestion, rhinorrhea, cough, sinus pressure, post nasal drip, SOB, or wheezing. Has been taking Aleave this week. She last took an antipyretic 11/09/13. She is "around a bunch of different people everyday." No known sick contacts. Decreased appetite. Has bene pushing fluids.   Of note, patient has been followed by Hem/Onc and GI for hepatosplenomegaly of unknown etiology since October 2014. This was found on her OV here, 04/16/13 for fatigue, malaise, and myalgias. On exam she was found to have RUQ and LUQ TTP. Had negative EBV and CMV titers at that time. Unremarkable CMP. Sed rate of 52. Unremarkable hematology workup. Per patient report the blood smear on 10/23/13 was normal. Last abdominal US March 2015 revealed spleen length of 13 cm and volume estimated at 626 cc. Impression: Mild hepatosplenomegaly. Next GI appointment in November. Hem/Onc has signed off.     Past Medical History  Diagnosis Date  . Depression   . Anxiety      Home Meds: Prior to Admission medications   Not on File    Allergies: No Known Allergies  History   Social History  . Marital Status: Single    Spouse Name: N/A    Number of Children: N/A  . Years of Education: N/A   Occupational History  . Not on file.   Social History Main Topics  . Smoking status: Never Smoker   . Smokeless tobacco: Not on file  . Alcohol Use: Not on file  . Drug Use: Not on file  . Sexual Activity: Not on file   Other Topics Concern  . Not  on file   Social History Narrative  . No narrative on file     Review of Systems  Constitutional: Positive for fever and appetite change. Negative for chills, fatigue and unexpected weight change.       T max: 99.5 at the beginning of the prior week.  Gets really hot.  No night sweats.  Has been riding a bike a bike at least every other day, sometimes everyday to lose weight at the recommendation of GI. Has lost 11 pounds since presentation back in Oct 2014.    HENT: Positive for ear pain and sore throat. Negative for congestion, hearing loss, postnasal drip, rhinorrhea, sinus pressure, sneezing and tinnitus.        Right otalgia a little more than left.  Right side of the throat hurts more than left.   Respiratory: Negative for cough, shortness of breath and wheezing.   Cardiovascular: Negative for chest pain.  Gastrointestinal: Positive for abdominal pain. Negative for nausea, vomiting and diarrhea.       RUQ pain, not new.   Musculoskeletal: Negative for myalgias, neck pain and neck stiffness.  Skin: Negative for rash.  Neurological: Positive for headaches.       Frontal sinus headache. 4-5/10.        Objective:   Physical Exam  Physical Exam: Blood pressure  122/82, pulse 80, temperature 99.3 F (37.4 C), temperature source Oral, resp. rate 16, height 5\' 6"  (1.676 m), weight 199 lb 6.4 oz (90.447 kg), last menstrual period 11/05/2013, SpO2 99.00%., Body mass index is 32.2 kg/(m^2). General: Well developed, well nourished, in no acute distress. Head: Normocephalic, atraumatic, eyes without discharge, sclera non-icteric, nares are without discharge. Bilateral auditory canals clear, TM's are without perforation, pearly grey and translucent with reflective cone of light bilaterally. Oral cavity moist, posterior pharynx erythematous with right sided exudate. No peritonsillar abscess or post nasal drip. Uvula midline. Tonsils 2+ bilaterally.    Neck: Supple. No thyromegaly. Full ROM.  Lymph nodes: less than 2 cm AC and PC bilaterally. Lungs: Clear bilaterally to auscultation without wheezes, rales, or rhonchi. Breathing is unlabored. Heart: RRR with S1 S2. No murmurs, rubs, or gallops appreciated. Abdomen: Soft, non-distended with normoactive bowel sounds. TTP RUQ and LUQ. Positive hepatosplenomegaly. No rebound/guarding. No obvious abdominal masses. Negative McBurney's.  Msk:  Strength and tone normal for age. Extremities/Skin: Warm and dry. No clubbing or cyanosis. No edema. No rashes or suspicious lesions. Neuro: Alert and oriented X 3. Moves all extremities spontaneously. Gait is normal. CNII-XII grossly in tact. Psych:  Responds to questions appropriately with a normal affect.    Labs: Results for orders placed in visit on 11/12/13  POCT CBC      Result Value Ref Range   WBC 5.7  4.6 - 10.2 K/uL   Lymph, poc 2.8  0.6 - 3.4   POC LYMPH PERCENT 50.0  10 - 50 %L   MID (cbc) 0.7  0 - 0.9   POC MID % 11.9  0 - 12 %M   POC Granulocyte 2.2  2 - 6.9   Granulocyte percent 38.1  37 - 80 %G   RBC 4.27  4.04 - 5.48 M/uL   Hemoglobin 12.2  12.2 - 16.2 g/dL   HCT, POC 17.3  56.7 - 47.9 %   MCV 89.4  80 - 97 fL   MCH, POC 28.6  27 - 31.2 pg   MCHC 31.9  31.8 - 35.4 g/dL   RDW, POC 01.4     Platelet Count, POC 110 (*) 142 - 424 K/uL   MPV 9.2  0 - 99.8 fL  POCT RAPID STREP A (OFFICE)      Result Value Ref Range   Rapid Strep A Screen Negative  Negative    Throat culture EBV titers, CMV titers, and CMP all pending.    Assessment & Plan:  23 year old female with likely mono, exudative pharyngitis, hepatosplenomegaly, and fatigue.   1) Likely mono/exudative pharyngitis/fatigue  -Supportive care -Push fluids -Rest -Out of work this week -EBV precautions -Recheck 48 hours, sooner if worse -Await labs  2) Hepatosplenomegaly  -Uncertain etiology -Close follow up with platelet count -Follow up with GI as directed -Precautions given -Avoid Tylenol -Will ask  about pruritis when I get labs back   Eula Listen, MHS, PA-C [facility] [address] [place], [place] [address] [phone] [facility] 11/12/2013 11:44 AM

## 2013-11-14 ENCOUNTER — Ambulatory Visit: Payer: Managed Care, Other (non HMO) | Admitting: Physician Assistant

## 2013-11-14 ENCOUNTER — Telehealth: Payer: Self-pay | Admitting: Physician Assistant

## 2013-11-14 VITALS — BP 126/74 | HR 106 | Temp 98.7°F | Resp 17 | Ht 66.5 in | Wt 199.0 lb

## 2013-11-14 DIAGNOSIS — R945 Abnormal results of liver function studies: Secondary | ICD-10-CM

## 2013-11-14 DIAGNOSIS — K7689 Other specified diseases of liver: Secondary | ICD-10-CM

## 2013-11-14 DIAGNOSIS — B2790 Infectious mononucleosis, unspecified without complication: Secondary | ICD-10-CM

## 2013-11-14 DIAGNOSIS — R7989 Other specified abnormal findings of blood chemistry: Secondary | ICD-10-CM

## 2013-11-14 DIAGNOSIS — R162 Hepatomegaly with splenomegaly, not elsewhere classified: Secondary | ICD-10-CM

## 2013-11-14 DIAGNOSIS — D696 Thrombocytopenia, unspecified: Secondary | ICD-10-CM

## 2013-11-14 LAB — POCT CBC
Granulocyte percent: 25.5 %G — AB (ref 37–80)
HCT, POC: 42.4 % (ref 37.7–47.9)
Hemoglobin: 13.7 g/dL (ref 12.2–16.2)
LYMPH, POC: 5.2 — AB (ref 0.6–3.4)
MCH, POC: 29.1 pg (ref 27–31.2)
MCHC: 32.3 g/dL (ref 31.8–35.4)
MCV: 90.2 fL (ref 80–97)
MID (CBC): 1.2 — AB (ref 0–0.9)
MPV: 9.8 fL (ref 0–99.8)
PLATELET COUNT, POC: 128 10*3/uL — AB (ref 142–424)
POC Granulocyte: 2.2 (ref 2–6.9)
POC LYMPH %: 60.3 % — AB (ref 10–50)
POC MID %: 14.2 %M — AB (ref 0–12)
RBC: 4.7 M/uL (ref 4.04–5.48)
RDW, POC: 13.8 %
WBC: 8.7 10*3/uL (ref 4.6–10.2)

## 2013-11-14 LAB — CULTURE, GROUP A STREP: Organism ID, Bacteria: NORMAL

## 2013-11-14 LAB — COMPREHENSIVE METABOLIC PANEL
ALK PHOS: 319 U/L — AB (ref 39–117)
ALT: 340 U/L — ABNORMAL HIGH (ref 0–35)
AST: 247 U/L — ABNORMAL HIGH (ref 0–37)
Albumin: 4.2 g/dL (ref 3.5–5.2)
BILIRUBIN TOTAL: 0.4 mg/dL (ref 0.2–1.2)
BUN: 10 mg/dL (ref 6–23)
CO2: 24 mEq/L (ref 19–32)
Calcium: 9.3 mg/dL (ref 8.4–10.5)
Chloride: 104 mEq/L (ref 96–112)
Creat: 0.91 mg/dL (ref 0.50–1.10)
Glucose, Bld: 80 mg/dL (ref 70–99)
Potassium: 4.4 mEq/L (ref 3.5–5.3)
Sodium: 137 mEq/L (ref 135–145)
Total Protein: 7.4 g/dL (ref 6.0–8.3)

## 2013-11-14 LAB — CYTOMEGALOVIRUS ANTIBODY, IGG: Cytomegalovirus Ab-IgG: 0.39 U/mL (ref <0.60)

## 2013-11-14 LAB — EPSTEIN-BARR VIRUS VCA ANTIBODY PANEL
EBV EA IgG: 52.8 U/mL — ABNORMAL HIGH (ref <9.0)
EBV NA IgG: 4.5 U/mL (ref <18.0)
EBV VCA IGG: 74 U/mL — AB (ref <18.0)
EBV VCA IgM: >160 U/mL — ABNORMAL HIGH (ref <36.0)

## 2013-11-14 LAB — PROTIME-INR
INR: 1.08 (ref <1.50)
Prothrombin Time: 13.9 seconds (ref 11.6–15.2)

## 2013-11-14 LAB — CMV IGM: CMV IGM: 25.7 [AU]/ml (ref <30.00)

## 2013-11-14 NOTE — Telephone Encounter (Signed)
Patient due for follow up today. Will discuss mono results with her then.

## 2013-11-14 NOTE — Progress Notes (Signed)
Subjective:    Patient ID: Melanie Crosby, female    DOB: [birth year], 23 y.o.   MRN: [id number]  HPI Primary Physician: Berkley Harvey, NP  Chief Complaint: Follow up EBV  HPI: 23 y.o. female with history of anxiety, depression, and hepatosplenomegaly of uncertain etiology for diagnosed in October 2014 presents for follow up EBV, thrombocytopenia, and elevated liver function tests. Patient initially presented to clinic on 11/12/13 with a 1 week history of fever and a 1 day history of sore throat. At that time T max was 99.5. She noticed "white spots" on the right side of her throat prompting her to come in for evaluation. Had noticed some increasing fatigue and headaches, rated 4-5/10. Labs were significant for WBC count 5.7, lymph 50.0%, platelet 110, alk phos 263, AST 264, ALT, 324, EBV VCA IgG +, EBV VCA IgM +, EBV EA IgG +, EBV NA IgG -, CMV IgM -, CMV IgG -, RST -, and negative throat culture. She was advised to stay home from work, given supportive care, and to follow up today.   Today she states that she feels about the same. Continued RUQ and LUQ abdominal pain. Again, this has been long standing since Fall 2014. Continued right sided sore throat. The right otalgia has resolved. Afebrile. No chills. Still with some fatigue that is up and down. Has a slight cough that is productive of clear sputum. Cough is worse in the morning when she wakes up in the morning. No SOB or wheezing. Has frontal and right temple headaches. Appetite remains the same. Pushing fluids.    Past Medical History  Diagnosis Date  . Depression   . Anxiety   . Hepatosplenomegaly 40/9811    Uncertain etiology     Home Meds: Prior to Admission medications   Not on File    Allergies: No Known Allergies  History   Social History  . Marital Status: Single    Spouse Name: N/A    Number of Children: N/A  . Years of Education: N/A   Occupational History  . Not on file.   Social History Main Topics  .  Smoking status: Never Smoker   . Smokeless tobacco: Not on file  . Alcohol Use: Not on file  . Drug Use: Not on file  . Sexual Activity: Not on file   Other Topics Concern  . Not on file   Social History Narrative  . No narrative on file     Review of Systems  Constitutional: Positive for appetite change and fatigue. Negative for fever and chills.       Fatigue up and down.  No change from past couple of days.  Pushing fluids.   HENT: Positive for sore throat. Negative for congestion, ear pain, postnasal drip, rhinorrhea, sinus pressure and sneezing.        Right sided sore throat.   Eyes: Positive for photophobia. Negative for visual disturbance.       "Walking out in the sun is awful."  Respiratory: Positive for cough. Negative for shortness of breath and wheezing.        Slight cough. Cough is productive of clear sputum. Cough is worse when she wakes up in the morning.   Gastrointestinal: Positive for abdominal pain and abdominal distention. Negative for nausea, vomiting, diarrhea and blood in stool.       Swollen feeling.  RUQ and LUQ.  Negative for melena.   Musculoskeletal: Negative for myalgias, neck pain and neck stiffness.  Skin:  Negative for rash.  Neurological: Positive for headaches.       Headache is located frontal region and right temple.        Objective:   Physical Exam  Physical Exam: Blood pressure 126/74, pulse 106, temperature 98.7 F (37.1 C), temperature source Oral, resp. rate 17, height 5' 6.5" (1.689 m), weight 199 lb (90.266 kg), last menstrual period 11/05/2013, SpO2 99.00%., Body mass index is 31.64 kg/(m^2). General: Well developed, well nourished, in no acute distress. Head: Normocephalic, atraumatic, eyes without discharge, sclera non-icteric, nares are without discharge. Bilateral auditory canals clear, TM's are without perforation, pearly grey and translucent with reflective cone of light bilaterally. Oral cavity moist, posterior pharynx  erythematous with exudate right greater than left. No erythema, peritonsillar abscess, or post nasal drip. Uvula midline.   Neck: Supple. No thyromegaly. Full ROM. No lymphadenopathy. Lungs: Clear bilaterally to auscultation without wheezes, rales, or rhonchi. Breathing is unlabored. Heart: RRR with S1 S2. No murmurs, rubs, or gallops appreciated. Abdomen: Soft, non-distended with normoactive bowel sounds. Positive for RUQ and LUQ TTP. Positive for hepatosplenomegaly. No rebound/guarding. No obvious abdominal masses. Negative McBurney's and table jar.  Msk:  Strength and tone normal for age. Extremities/Skin: Warm and dry. No clubbing or cyanosis. No edema. No rashes or suspicious lesions. Neuro: Alert and oriented X 3. Moves all extremities spontaneously. Gait is normal. CNII-XII grossly in tact. Psych:  Responds to questions appropriately with a normal affect.    Labs: Recent Results (from the past 2160 hour(s))  CBC WITH DIFFERENTIAL     Status: None   Collection Time    10/23/13 10:43 AM      Result Value Ref Range   WBC 7.7  3.9 - 10.3 10e3/uL   NEUT# 5.6  1.5 - 6.5 10e3/uL   HGB 12.9  11.6 - 15.9 g/dL   HCT 38.6  34.8 - 46.6 %   Platelets 212  145 - 400 10e3/uL   MCV 88.0  79.5 - 101.0 fL   MCH 29.3  25.1 - 34.0 pg   MCHC 33.3  31.5 - 36.0 g/dL   RBC 4.38  3.70 - 5.45 10e6/uL   RDW 13.0  11.2 - 14.5 %   lymph# 1.5  0.9 - 3.3 10e3/uL   MONO# 0.5  0.1 - 0.9 10e3/uL   Eosinophils Absolute 0.0  0.0 - 0.5 10e3/uL   Basophils Absolute 0.0  0.0 - 0.1 10e3/uL   NEUT% 72.9  38.4 - 76.8 %   LYMPH% 19.5  14.0 - 49.7 %   MONO% 6.7  0.0 - 14.0 %   EOS% 0.6  0.0 - 7.0 %   BASO% 0.3  0.0 - 2.0 %  [facility] SMEAR     Status: None   Collection Time    10/23/13 10:43 AM      Result Value Ref Range   Smear Result Smear Available    COMPREHENSIVE METABOLIC PANEL (BT59)     Status: None   Collection Time    10/23/13 10:43 AM      Result Value Ref Range   Sodium 142  136 - 145 mEq/L    Potassium 3.9  3.5 - 5.1 mEq/L   Chloride 109  98 - 109 mEq/L   CO2 25  22 - 29 mEq/L   Glucose 83  70 - 140 mg/dl   BUN 13.5  7.0 - 26.0 mg/dL   Creatinine 0.9  0.6 - 1.1 mg/dL   Total Bilirubin 0.33  0.20 -  1.20 mg/dL   Alkaline Phosphatase 62  40 - 150 U/L   AST 17  5 - 34 U/L   ALT 12  0 - 55 U/L   Total Protein 7.5  6.4 - 8.3 g/dL   Albumin 3.9  3.5 - 5.0 g/dL   Calcium 9.8  8.4 - 10.4 mg/dL   Anion Gap 8  3 - 11 mEq/L  COMPREHENSIVE METABOLIC PANEL     Status: Abnormal   Collection Time    11/12/13  9:00 AM      Result Value Ref Range   Sodium 136  135 - 145 mEq/L   Potassium 4.4  3.5 - 5.3 mEq/L   Chloride 102  96 - 112 mEq/L   CO2 23  19 - 32 mEq/L   Glucose, Bld 85  70 - 99 mg/dL   BUN 12  6 - 23 mg/dL   Creat 0.95  0.50 - 1.10 mg/dL   Total Bilirubin 0.4  0.2 - 1.2 mg/dL   Alkaline Phosphatase 263 (*) 39 - 117 U/L   AST 264 (*) 0 - 37 U/L   ALT 324 (*) 0 - 35 U/L   Total Protein 7.4  6.0 - 8.3 g/dL   Albumin 4.2  3.5 - 5.2 g/dL   Calcium 9.1  8.4 - 10.5 mg/dL  EPSTEIN-BARR VIRUS VCA ANTIBODY PANEL     Status: Abnormal   Collection Time    11/12/13  9:00 AM      Result Value Ref Range   EBV VCA IgG 74.0 (*) <18.0 U/mL   Comment:       Reference Range:       <18.0 U/mL = Negative                        18.0-21.9 U/mL = Equivocal                           >=22.0 U/mL = Positive   EBV VCA IgM >160.0 (*) <36.0 U/mL   Comment:       Reference Range:       <36.0 U/mL = Negative                        36.0-43.9 U/mL = Equivocal                           >=44.0 U/mL = Positive   EBV EA IgG 52.8 (*) <9.0 U/mL   Comment:       Reference Range:        <9.0 U/mL = Negative                         9.0-10.9 U/mL = Equivocal                           >=11.0 U/mL = Positive           Assay cross-reactivity for Early Antigen (EA) has been noted with some     specimens containing antibody to Human Immunodeficiency Virus (HIV).     HIV disease must be excluded before  confirmation of EBV diagnosis.     EBV NA IgG 4.5  <18.0 U/mL   Comment:       Reference Range:       <18.0  U/mL = Negative                        18.0-21.9 U/mL = Equivocal                           >=22.0 U/mL = Positive                  Clinical Stage            VCA IgG   VCA IgM      EA    EBV NA                  Susceptibility               -         -         -       -            Very Early Infection        +/-       +/-        -       -            Established Infection        +         +        +/-      -            Recent Infection             +         +        +/-     +/-            Past Infection               +         -        +/-      +                                                                                       +/- means positive or negative (not weak)     High persisting antibody levels may be present in Burkitt's lymphoma     and nasopharyngeal carcinoma.  CMV IGM     Status: None   Collection Time    11/12/13  9:00 AM      Result Value Ref Range   CMV IgM 25.70  <30.00 AU/mL   Comment:       Reference Range:        <30.00 AU/mL = Negative                        30.00-34.99 AU/mL = Equivocal                            >=35.00 AU/mL = Positive           Results from any one IgM assay should  not be used as a sole     determinant of a current or recent infection. Because an IgM test can     yield false positive results and low levels of IgM antibody may     persist for more than 12 months post infection, reliance on a single     test result could be misleading. If an acute infection is suspected,     consider obtaining a new specimen and submit for both IgG and IgM     testing in two or more weeks.        CYTOMEGALOVIRUS ANTIBODY, IGG     Status: None   Collection Time    11/12/13  9:00 AM      Result Value Ref Range   Cytomegalovirus Ab-IgG 0.39  <0.60 U/mL   Comment:       Reference Range:          <0.60 U/mL = Negative                            0.60-0.69 U/mL = Equivocal                              >=0.70 U/mL = Positive            A positive result indicates that the patient has antibodies to CMV.     It does not differentiate between an active or past infection.     The clinical diagnosis must be interpreted in conjunction with the     clinical signs and symptoms of the patient.        POCT RAPID STREP A (OFFICE)     Status: None   Collection Time    11/12/13  9:02 AM      Result Value Ref Range   Rapid Strep A Screen Negative  Negative  CULTURE, GROUP A STREP     Status: None   Collection Time    11/12/13  9:02 AM      Result Value Ref Range   Organism ID, Bacteria Normal Upper Respiratory Flora     Organism ID, Bacteria No Beta Hemolytic Streptococci Isolated    POCT CBC     Status: Abnormal   Collection Time    11/12/13  9:03 AM      Result Value Ref Range   WBC 5.7  4.6 - 10.2 K/uL   Lymph, poc 2.8  0.6 - 3.4   POC LYMPH PERCENT 50.0  10 - 50 %L   MID (cbc) 0.7  0 - 0.9   POC MID % 11.9  0 - 12 %M   POC Granulocyte 2.2  2 - 6.9   Granulocyte percent 38.1  37 - 80 %G   RBC 4.27  4.04 - 5.48 M/uL   Hemoglobin 12.2  12.2 - 16.2 g/dL   HCT, POC 38.2  37.7 - 47.9 %   MCV 89.4  80 - 97 fL   MCH, POC 28.6  27 - 31.2 pg   MCHC 31.9  31.8 - 35.4 g/dL   RDW, POC 13.6     Platelet Count, POC 110 (*) 142 - 424 K/uL   MPV 9.2  0 - 99.8 fL     Results for orders placed in visit on 11/14/13  POCT CBC      Result Value Ref Range   WBC 8.7  4.6 - 10.2  K/uL   Lymph, poc 5.2 (*) 0.6 - 3.4   POC LYMPH PERCENT 60.3 (*) 10 - 50 %L   MID (cbc) 1.2 (*) 0 - 0.9   POC MID % 14.2 (*) 0 - 12 %M   POC Granulocyte 2.2  2 - 6.9   Granulocyte percent 25.5 (*) 37 - 80 %G   RBC 4.70  4.04 - 5.48 M/uL   Hemoglobin 13.7  12.2 - 16.2 g/dL   HCT, POC 42.4  37.7 - 47.9 %   MCV 90.2  80 - 97 fL   MCH, POC 29.1  27 - 31.2 pg   MCHC 32.3  31.8 - 35.4 g/dL   RDW, POC 13.8     Platelet Count, POC 128 (*) 142 - 424 K/uL   MPV 9.8  0 -  99.8 fL    CMP and PT/INR pending    Assessment & Plan:  23 year old female with EBV, hepatosplenomegaly of certain etiology, thrombocytopenia, exudative pharyngitis, and fatigue,   1) EBV/exudative pharyngitis/fatigue -Improved platelet count -Supportive care -Advance activity as tolerated  -Push fluids -EBV precautions -Recheck LFT's 48 hours  2) Hepatosplenomegaly  -CMP and PT/INR stat -Does not appear EBV is the etiology given the results of the EBV titers  -Patient to follow up with hem/onc and GI as planned  -Consider viral illness/post infectious response as initial cause vs CA less likely given the hematological work up thus far -Dates back to known date of October 2014, had negative EBV and CMV titers at that time  3) Thrombocytopenia -Improved -Monitor   Christell Faith, MHS, PA-C [facility] [place], [place] [address] [place] 11/14/2013 10:10 AM

## 2013-11-16 ENCOUNTER — Telehealth: Payer: Self-pay

## 2013-11-16 ENCOUNTER — Ambulatory Visit: Payer: Managed Care, Other (non HMO) | Admitting: Physician Assistant

## 2013-11-16 VITALS — BP 106/66 | HR 103 | Temp 98.5°F | Resp 16 | Ht 66.0 in | Wt 199.0 lb

## 2013-11-16 DIAGNOSIS — J029 Acute pharyngitis, unspecified: Secondary | ICD-10-CM

## 2013-11-16 DIAGNOSIS — B2790 Infectious mononucleosis, unspecified without complication: Secondary | ICD-10-CM

## 2013-11-16 DIAGNOSIS — R7989 Other specified abnormal findings of blood chemistry: Secondary | ICD-10-CM

## 2013-11-16 DIAGNOSIS — R945 Abnormal results of liver function studies: Secondary | ICD-10-CM

## 2013-11-16 LAB — POCT CBC
Granulocyte percent: 25.9 %G — AB (ref 37–80)
HCT, POC: 42.3 % (ref 37.7–47.9)
Hemoglobin: 13.6 g/dL (ref 12.2–16.2)
Lymph, poc: 5.7 — AB (ref 0.6–3.4)
MCH: 29.1 pg (ref 27–31.2)
MCHC: 32.2 g/dL (ref 31.8–35.4)
MCV: 90.4 fL (ref 80–97)
MID (CBC): 1.2 — AB (ref 0–0.9)
MPV: 9.3 fL (ref 0–99.8)
PLATELET COUNT, POC: 136 10*3/uL — AB (ref 142–424)
POC Granulocyte: 2.4 (ref 2–6.9)
POC LYMPH %: 61.2 % — AB (ref 10–50)
POC MID %: 12.9 %M — AB (ref 0–12)
RBC: 4.68 M/uL (ref 4.04–5.48)
RDW, POC: 14.3 %
WBC: 9.3 10*3/uL (ref 4.6–10.2)

## 2013-11-16 LAB — HEPATIC FUNCTION PANEL
ALBUMIN: 3.8 g/dL (ref 3.5–5.2)
ALT: 345 U/L — AB (ref 0–35)
AST: 242 U/L — ABNORMAL HIGH (ref 0–37)
Alkaline Phosphatase: 301 U/L — ABNORMAL HIGH (ref 39–117)
BILIRUBIN INDIRECT: 0.3 mg/dL (ref 0.2–1.2)
Bilirubin, Direct: 0.1 mg/dL (ref 0.0–0.3)
TOTAL PROTEIN: 7.5 g/dL (ref 6.0–8.3)
Total Bilirubin: 0.4 mg/dL (ref 0.2–1.2)

## 2013-11-16 MED ORDER — FIRST-DUKES MOUTHWASH MT SUSP: OROMUCOSAL | Status: DC

## 2013-11-16 NOTE — Progress Notes (Signed)
Subjective:    Patient ID: Melanie Crosby, female    DOB: [birth year], 23 y.o.   MRN: [id number]  HPI   Melanie Crosby is a very pleasant 23 yr old female here for follow up on infectious mononucleosis diagnosed here 11/12/13 with EBV titer indicating acute infection.  LFTs have been trending upward.  Plt initially low but appear to be rebounding.  Today pt reports she is feeling about the same.  Sore throat is worst/most bothersome symptom.  She is not using anything for sore throat.  She denies fever or chills.  She continues to have RUQ and LUQ abdominal pain - this has been ongoing since Oct 2014 at which time diagnosed with hepatosplenomegaly of unknown etiology.  She denies NVD.  Appetite is slightly decreased but she continues to tolerate PO and is staying hydrated   Review of Systems  Constitutional: Negative for fever and chills.  HENT: Positive for congestion, rhinorrhea and sore throat.   Respiratory: Negative for cough, shortness of breath and wheezing.   Cardiovascular: Negative.   Gastrointestinal: Positive for abdominal pain. Negative for nausea, vomiting and diarrhea.  Musculoskeletal: Negative.   Skin: Negative.        Objective:   Physical Exam  Vitals reviewed. Constitutional: She is oriented to person, place, and time. She appears well-developed and well-nourished. No distress.  HENT:  Head: Normocephalic and atraumatic.  Eyes: Conjunctivae are normal. No scleral icterus.  Neck: Neck supple.  Cardiovascular: Normal rate, regular rhythm and normal heart sounds.   Pulmonary/Chest: Effort normal and breath sounds normal. She has no wheezes. She has no rales.  Abdominal: Soft. There is hepatosplenomegaly. There is tenderness in the right upper quadrant and left upper quadrant. There is guarding. There is no rigidity, no rebound and no CVA tenderness.  Lymphadenopathy:    She has cervical adenopathy (tender, anterior).  Neurological: She is alert and oriented to person,  place, and time.  Skin: Skin is warm and dry.  Psychiatric: She has a normal mood and affect. Her behavior is normal.      Results for orders placed in visit on 11/16/13  HEPATIC FUNCTION PANEL      Result Value Ref Range   Total Bilirubin 0.4  0.2 - 1.2 mg/dL   Bilirubin, Direct 0.1  0.0 - 0.3 mg/dL   Indirect Bilirubin 0.3  0.2 - 1.2 mg/dL   Alkaline Phosphatase 301 (*) 39 - 117 U/L   AST 242 (*) 0 - 37 U/L   ALT 345 (*) 0 - 35 U/L   Total Protein 7.5  6.0 - 8.3 g/dL   Albumin 3.8  3.5 - 5.2 g/dL  POCT CBC      Result Value Ref Range   WBC 9.3  4.6 - 10.2 K/uL   Lymph, poc 5.7 (*) 0.6 - 3.4   POC LYMPH PERCENT 61.2 (*) 10 - 50 %L   MID (cbc) 1.2 (*) 0 - 0.9   POC MID % 12.9 (*) 0 - 12 %M   POC Granulocyte 2.4  2 - 6.9   Granulocyte percent 25.9 (*) 37 - 80 %G   RBC 4.68  4.04 - 5.48 M/uL   Hemoglobin 13.6  12.2 - 16.2 g/dL   HCT, POC 84.1  66.0 - 47.9 %   MCV 90.4  80 - 97 fL   MCH, POC 29.1  27 - 31.2 pg   MCHC 32.2  31.8 - 35.4 g/dL   RDW, POC 14.3  Platelet Count, POC 136 (*) 142 - 424 K/uL   MPV 9.3  0 - 99.8 fL       Assessment & Plan:  Infectious mononucleosis - Plan: POCT CBC, Hepatic function panel  Elevated LFTs - Plan: Hepatic function panel   Melanie Crosby is a very pleasant 23 yr old female here for repeat labs following dx of infectious mononucleosis.  She continues to feel about the same today.  Sore throat quite bothersome - will try Duke's and ibuprofen for this.  Plt trending back up.  LFTs appear to have plateaued.  Continue conservative management for now.  Consider rechecking CBC, LFTs in a couple weeks to ensure return to baseline.  Pt to call or RTC if worsening or not improving  E. Frances FurbishElizabeth Khye Hochstetler MHS, PA-C [facility] 5/29/20153:59 PM

## 2013-11-16 NOTE — Telephone Encounter (Signed)
Script sent. Pt advised.

## 2013-11-16 NOTE — Telephone Encounter (Signed)
Pt was in this morning, and was prescribed a mouth wash for her sore throat, the pharmacy still has not called that in, please check

## 2013-11-16 NOTE — Patient Instructions (Signed)
Take ibuprofen 600mg  every 8 hours with some food as needed for throat pain  Use the magic mouthwash every 2 hours if needed for sore throat relief  Plenty of fluids and rest.  Avoid strenuous activity (working out, contact sports, etc)  Please let us know if you need your work note extended   Infectious Mononucleosis Infectious mononucleosis (mono) is a common germ (viral) infection in children, teenagers, and young adults.  CAUSES  Mono is an infection caused by the Malachi Carl virus. The virus is spread by close personal contact with someone who has the infection. It can be passed by contact with your saliva through things such as kissing or sharing drinking glasses. Sometimes, the infection can be spread from someone who does not appear sick but still spreads the virus (asymptomatic carrier state).  SYMPTOMS  The most common symptoms of Mono are:  Sore throat.  Headache.  Fatigue.  Muscle aches.  Swollen glands.  Fever.  Poor appetite.  Enlarged liver or spleen. The less common symptoms can include:  Rash.  Feeling sick to your stomach (nauseous).  Abdominal pain. DIAGNOSIS  Mono is diagnosed by a blood test.  TREATMENT  Treatment of mono is usually at home. There is no medicine that cures this virus. Sometimes hospital treatment is needed in severe cases. Steroid medicine sometimes is needed if the swelling in the throat causes breathing or swallowing problems.  HOME CARE INSTRUCTIONS   Drink enough fluids to keep your urine clear or pale yellow.  Eat soft foods. Cool foods like popsicles or ice cream can soothe a sore throat.  Only take over-the-counter or prescription medicines for pain, discomfort, or fever as directed by your caregiver. Children under 66 years of age should not take aspirin.  Gargle salt water. This may help relieve your sore throat. Put 1 teaspoon (tsp) of salt in 1 cup of warm water. Sucking on hard candy may also help.  Rest as  needed.  Start regular activities gradually after the fever is gone. Be sure to rest when tired.  Avoid strenuous exercise or contact sports until your caregiver says it is okay. The liver and spleen could be seriously injured.  Avoid sharing drinking glasses or kissing until your caregiver tells you that you are no longer contagious. SEEK MEDICAL CARE IF:   Your fever is not gone after 7 days.  Your activity level is not back to normal after 2 weeks.  You have yellow coloring to eyes and skin (jaundice). SEEK IMMEDIATE MEDICAL CARE IF:   You have severe pain in the abdomen or shoulder.  You have trouble swallowing or drooling.  You have trouble breathing.  You develop a stiff neck.  You develop a severe headache.  You cannot stop throwing up (vomiting).  You have convulsions.  You are confused.  You have trouble with balance.  You develop signs of body fluid loss (dehydration):  Weakness.  Sunken eyes.  Pale skin.  Dry mouth.  Rapid breathing or pulse. MAKE SURE YOU:   Understand these instructions.  Will watch your condition.  Will get help right away if you are not doing well or get worse. Document Released: 06/04/2000 Document Revised: 08/30/2011 Document Reviewed: 04/02/2008 [facility] Patient Information 2014 [place], [place].

## 2013-11-19 ENCOUNTER — Telehealth: Payer: Self-pay

## 2013-11-19 DIAGNOSIS — B2790 Infectious mononucleosis, unspecified without complication: Secondary | ICD-10-CM

## 2013-11-19 NOTE — Telephone Encounter (Signed)
PT STATES SHE HAVE BEEN COMING OVER EVERY OTHER DAY FOR BLOOD WORK AND WANTED TO KNOW FROM RYAN WHEN SHE NEED TO COME BACK PLEASE CALL [phone]

## 2013-11-19 NOTE — Telephone Encounter (Signed)
Liver function has stabilized. Please have patient come back in for a lab only visit Friday 11/23/13. Disregard my lab note.

## 2013-11-19 NOTE — Telephone Encounter (Signed)
Pt notified. Do you want me to put in a future order for you?

## 2013-11-19 NOTE — Telephone Encounter (Signed)
Order placed

## 2013-11-23 ENCOUNTER — Ambulatory Visit: Payer: Managed Care, Other (non HMO) | Admitting: Physician Assistant

## 2013-11-23 VITALS — BP 95/66 | HR 91 | Temp 98.7°F | Resp 16 | Ht 66.0 in | Wt 203.2 lb

## 2013-11-23 DIAGNOSIS — R161 Splenomegaly, not elsewhere classified: Secondary | ICD-10-CM

## 2013-11-23 DIAGNOSIS — R945 Abnormal results of liver function studies: Secondary | ICD-10-CM

## 2013-11-23 DIAGNOSIS — R7989 Other specified abnormal findings of blood chemistry: Secondary | ICD-10-CM

## 2013-11-23 DIAGNOSIS — B2790 Infectious mononucleosis, unspecified without complication: Secondary | ICD-10-CM

## 2013-11-23 DIAGNOSIS — R160 Hepatomegaly, not elsewhere classified: Secondary | ICD-10-CM

## 2013-11-23 LAB — POCT CBC
Granulocyte percent: 28.4 %G — AB (ref 37–80)
HEMATOCRIT: 38.4 % (ref 37.7–47.9)
Hemoglobin: 12 g/dL — AB (ref 12.2–16.2)
Lymph, poc: 3.7 — AB (ref 0.6–3.4)
MCH, POC: 28 pg (ref 27–31.2)
MCHC: 31.3 g/dL — AB (ref 31.8–35.4)
MCV: 89.5 fL (ref 80–97)
MID (cbc): 0.8 (ref 0–0.9)
MPV: 9.5 fL (ref 0–99.8)
POC GRANULOCYTE: 1.8 — AB (ref 2–6.9)
POC LYMPH PERCENT: 58.7 %L — AB (ref 10–50)
POC MID %: 12.9 %M — AB (ref 0–12)
Platelet Count, POC: 152 10*3/uL (ref 142–424)
RBC: 4.29 M/uL (ref 4.04–5.48)
RDW, POC: 14.5 %
WBC: 6.3 10*3/uL (ref 4.6–10.2)

## 2013-11-23 LAB — ACUTE HEP PANEL AND HEP B SURFACE AB
HCV AB: NEGATIVE
HEP A IGM: NONREACTIVE
Hep B C IgM: NONREACTIVE
Hep B S Ab: POSITIVE — AB
Hepatitis B Surface Ag: NEGATIVE

## 2013-11-23 LAB — HEPATIC FUNCTION PANEL
ALT: 340 U/L — ABNORMAL HIGH (ref 0–35)
AST: 261 U/L — AB (ref 0–37)
Albumin: 3.7 g/dL (ref 3.5–5.2)
Alkaline Phosphatase: 182 U/L — ABNORMAL HIGH (ref 39–117)
BILIRUBIN DIRECT: 0.1 mg/dL (ref 0.0–0.3)
BILIRUBIN TOTAL: 0.4 mg/dL (ref 0.2–1.2)
Indirect Bilirubin: 0.3 mg/dL (ref 0.2–1.2)
Total Protein: 7.1 g/dL (ref 6.0–8.3)

## 2013-11-23 NOTE — Progress Notes (Signed)
Subjective:    Patient ID: Melanie Crosby, female    DOB: [birth year], 23 y.o.   MRN: [id number]  HPI Primary Physician: Iona Hansen, NP  Chief Complaint: Follow up of mono  HPI: 23 y.o. female with history of hepatosplenomegaly of uncertain etiology originally diagnosed October 2014, anxiety, and depression presents for follow up of infectious mononucleosis initially diagnosed on 11/12/13 and elevated LFTs.   Today she reports she is feeling better over all. Her sore throat has improved compared to the previous week. She no longer sees any "white spots" and it does not feel as swollen. Still gets hot at times. No chills. At times her energy will return back to baseline, then she will have to take a nap. Today was the easiest day to get up and out of bed as her throat did not hurt that much.   Back at work. 6/1 and 6/2 were "pretty rough." Tired all of the time. Trying to take it easy. Since then work has gotten better. Has gotten to a point where she has been more productive at work. Worked 11 hours the previous day at work and was not that tired when she got home.   Not due to see GI until November for follow up [place] of abdomen.   She mentions, traveling to [place] in 2014. Went to [place], [place], [place], [place], [place], [place], [place], [place], [place]. She was abroad from May 9-May 24. Patient brings up the possibility of a parasite form this trip and wonders if this is why she has gotten continually sick.   No tattoos. No piercing currently. Had ears done around age 77 for about 6 months. No IV drug use.     Past Medical History  Diagnosis Date  . Depression   . Anxiety   . Hepatosplenomegaly 03/2013    Uncertain etiology     Home Meds: Prior to Admission medications   Medication Sig Start Date End Date Taking? Authorizing Provider  Diphenhyd-Hydrocort-Nystatin (FIRST-DUKES MOUTHWASH) SUSP Use 1 teaspoon as rinse and gargle and spit 4 times a day 11/16/13  Yes Eleanore E  Debbra Riding, PA-C    Allergies: No Known Allergies  History   Social History  . Marital Status: Single    Spouse Name: N/A    Number of Children: N/A  . Years of Education: N/A   Occupational History  . Not on file.   Social History Main Topics  . Smoking status: Never Smoker   . Smokeless tobacco: Not on file  . Alcohol Use: No  . Drug Use: No  . Sexual Activity: Not on file   Other Topics Concern  . Not on file   Social History Narrative  . No narrative on file     Review of Systems  Constitutional: Positive for fever, appetite change and fatigue. Negative for chills.       Will get really hot. 2 days ago she felt normal and wanted to do stuff. Energy has been good at times.   Appetite improved.   HENT: Positive for congestion, postnasal drip and sore throat. Negative for ear pain, hearing loss, rhinorrhea and sinus pressure.        Improving ST.  Mild nasal congestion.  Otalgia improved as the ST improved.   Respiratory: Positive for cough. Negative for shortness of breath and wheezing.        Mild cough at night.   Gastrointestinal: Positive for abdominal pain and abdominal distention. Negative for nausea, vomiting, diarrhea and blood  in stool.       Sore in RUQ and LUQ on and off. There are times when she is pain free, but it still feels swollen along RUQ and LUQ.  Negative for melena.  Negative for hematemesis.   Musculoskeletal: Negative for myalgias.  Skin: Negative for rash.  Neurological: Positive for headaches.       Headache this morning along the frontal region.        Objective:   Physical Exam  Physical Exam: Blood pressure 95/66, pulse 91, temperature 98.7 F (37.1 C), temperature source Oral, resp. rate 16, height 5\' 6"  (1.676 m), weight 203 lb 3.2 oz (92.171 kg), last menstrual period 11/05/2013, SpO2 97.00%., Body mass index is 32.81 kg/(m^2). General: Well developed, well nourished, in no acute distress. Head: Normocephalic, atraumatic, eyes  without discharge, sclera non-icteric, nares are without discharge. Bilateral auditory canals clear, TM's are without perforation, pearly grey and translucent with reflective cone of light bilaterally. Oral cavity moist, posterior pharynx without exudate, erythema, peritonsillar abscess, or post nasal drip. Uvula midline.   Neck: Supple. No thyromegaly. Full ROM. Lymph nodes: less than 2 cm anterior bilaterally. No nuchal rigidity.  Lungs: Clear bilaterally to auscultation without wheezes, rales, or rhonchi. Breathing is unlabored. Heart: RRR with S1 S2. No murmurs, rubs, or gallops appreciated. Abdomen: Soft, non-distended with normoactive bowel sounds. TTP along RUQ and LUQ. Positive for hepatosplenomegaly. No rebound/guarding. No obvious abdominal masses. Msk:  Strength and tone normal for age. Extremities/Skin: Warm and dry. No clubbing or cyanosis. No edema. No rashes or suspicious lesions. Neuro: Alert and oriented X 3. Moves all extremities spontaneously. Gait is normal. CNII-XII grossly in tact. Psych:  Responds to questions appropriately with a normal affect.    Labs: Recent Results (from the past 2160 hour(s))  CBC WITH DIFFERENTIAL     Status: None   Collection Time    10/23/13 10:43 AM      Result Value Ref Range   WBC 7.7  3.9 - 10.3 10e3/uL   NEUT# 5.6  1.5 - 6.5 10e3/uL   HGB 12.9  11.6 - 15.9 g/dL   HCT 16.1  09.6 - 04.5 %   Platelets 212  145 - 400 10e3/uL   MCV 88.0  79.5 - 101.0 fL   MCH 29.3  25.1 - 34.0 pg   MCHC 33.3  31.5 - 36.0 g/dL   RBC 4.09  8.11 - 9.14 10e6/uL   RDW 13.0  11.2 - 14.5 %   lymph# 1.5  0.9 - 3.3 10e3/uL   MONO# 0.5  0.1 - 0.9 10e3/uL   Eosinophils Absolute 0.0  0.0 - 0.5 10e3/uL   Basophils Absolute 0.0  0.0 - 0.1 10e3/uL   NEUT% 72.9  38.4 - 76.8 %   LYMPH% 19.5  14.0 - 49.7 %   MONO% 6.7  0.0 - 14.0 %   EOS% 0.6  0.0 - 7.0 %   BASO% 0.3  0.0 - 2.0 %  [facility] SMEAR     Status: None   Collection Time    10/23/13 10:43 AM      Result Value  Ref Range   Smear Result Smear Available    COMPREHENSIVE METABOLIC PANEL ([facility])     Status: None   Collection Time    10/23/13 10:43 AM      Result Value Ref Range   Sodium 142  136 - 145 mEq/L   Potassium 3.9  3.5 - 5.1 mEq/L   Chloride 109  98 -  109 mEq/L   CO2 25  22 - 29 mEq/L   Glucose 83  70 - 140 mg/dl   BUN 16.1  7.0 - 09.6 mg/dL   Creatinine 0.9  0.6 - 1.1 mg/dL   Total Bilirubin 0.45  0.20 - 1.20 mg/dL   Alkaline Phosphatase 62  40 - 150 U/L   AST 17  5 - 34 U/L   ALT 12  0 - 55 U/L   Total Protein 7.5  6.4 - 8.3 g/dL   Albumin 3.9  3.5 - 5.0 g/dL   Calcium 9.8  8.4 - 40.9 mg/dL   Anion Gap 8  3 - 11 mEq/L  COMPREHENSIVE METABOLIC PANEL     Status: Abnormal   Collection Time    11/12/13  9:00 AM      Result Value Ref Range   Sodium 136  135 - 145 mEq/L   Potassium 4.4  3.5 - 5.3 mEq/L   Chloride 102  96 - 112 mEq/L   CO2 23  19 - 32 mEq/L   Glucose, Bld 85  70 - 99 mg/dL   BUN 12  6 - 23 mg/dL   Creat 8.11  9.14 - 7.82 mg/dL   Total Bilirubin 0.4  0.2 - 1.2 mg/dL   Alkaline Phosphatase 263 (*) 39 - 117 U/L   AST 264 (*) 0 - 37 U/L   ALT 324 (*) 0 - 35 U/L   Total Protein 7.4  6.0 - 8.3 g/dL   Albumin 4.2  3.5 - 5.2 g/dL   Calcium 9.1  8.4 - 95.6 mg/dL  EPSTEIN-BARR VIRUS VCA ANTIBODY PANEL     Status: Abnormal   Collection Time    11/12/13  9:00 AM      Result Value Ref Range   EBV VCA IgG 74.0 (*) <18.0 U/mL   Comment:       Reference Range:       <18.0 U/mL = Negative                        18.0-21.9 U/mL = Equivocal                           >=22.0 U/mL = Positive   EBV VCA IgM >160.0 (*) <36.0 U/mL   Comment:       Reference Range:       <36.0 U/mL = Negative                        36.0-43.9 U/mL = Equivocal                           >=44.0 U/mL = Positive   EBV EA IgG 52.8 (*) <9.0 U/mL   Comment:       Reference Range:        <9.0 U/mL = Negative                         9.0-10.9 U/mL = Equivocal                           >=11.0 U/mL = Positive            Assay cross-reactivity for Early Antigen (EA) has been noted with some     specimens containing antibody to Human  Immunodeficiency Virus (HIV).     HIV disease must be excluded before confirmation of EBV diagnosis.     EBV NA IgG 4.5  <18.0 U/mL   Comment:       Reference Range:       <18.0 U/mL = Negative                        18.0-21.9 U/mL = Equivocal                           >=22.0 U/mL = Positive                  Clinical Stage            VCA IgG   VCA IgM      EA    EBV NA                  Susceptibility               -         -         -       -            Very Early Infection        +/-       +/-        -       -            Established Infection        +         +        +/-      -            Recent Infection             +         +        +/-     +/-            Past Infection               +         -        +/-      +                                                                                       +/- means positive or negative (not weak)     High persisting antibody levels may be present in Burkitt's lymphoma     and nasopharyngeal carcinoma.  CMV IGM     Status: None   Collection Time    11/12/13  9:00 AM      Result Value Ref Range   CMV IgM 25.70  <30.00 AU/mL   Comment:       Reference Range:        <30.00 AU/mL = Negative                        30.00-34.99 AU/mL = Equivocal                            >=  35.00 AU/mL = Positive           Results from any one IgM assay should not be used as a sole     determinant of a current or recent infection. Because an IgM test can     yield false positive results and low levels of IgM antibody may     persist for more than 12 months post infection, reliance on a single     test result could be misleading. If an acute infection is suspected,     consider obtaining a new specimen and submit for both IgG and IgM     testing in two or more weeks.        CYTOMEGALOVIRUS ANTIBODY, IGG     Status: None    Collection Time    11/12/13  9:00 AM      Result Value Ref Range   Cytomegalovirus Ab-IgG 0.39  <0.60 U/mL   Comment:       Reference Range:          <0.60 U/mL = Negative                           0.60-0.69 U/mL = Equivocal                              >=0.70 U/mL = Positive            A positive result indicates that the patient has antibodies to CMV.     It does not differentiate between an active or past infection.     The clinical diagnosis must be interpreted in conjunction with the     clinical signs and symptoms of the patient.        POCT RAPID STREP A (OFFICE)     Status: None   Collection Time    11/12/13  9:02 AM      Result Value Ref Range   Rapid Strep A Screen Negative  Negative  CULTURE, GROUP A STREP     Status: None   Collection Time    11/12/13  9:02 AM      Result Value Ref Range   Organism ID, Bacteria Normal Upper Respiratory Flora     Organism ID, Bacteria No Beta Hemolytic Streptococci Isolated    POCT CBC     Status: Abnormal   Collection Time    11/12/13  9:03 AM      Result Value Ref Range   WBC 5.7  4.6 - 10.2 K/uL   Lymph, poc 2.8  0.6 - 3.4   POC LYMPH PERCENT 50.0  10 - 50 %L   MID (cbc) 0.7  0 - 0.9   POC MID % 11.9  0 - 12 %M   POC Granulocyte 2.2  2 - 6.9   Granulocyte percent 38.1  37 - 80 %G   RBC 4.27  4.04 - 5.48 M/uL   Hemoglobin 12.2  12.2 - 16.2 g/dL   HCT, POC 16.1  09.6 - 47.9 %   MCV 89.4  80 - 97 fL   MCH, POC 28.6  27 - 31.2 pg   MCHC 31.9  31.8 - 35.4 g/dL   RDW, POC 04.5     Platelet Count, POC 110 (*) 142 - 424 K/uL   MPV 9.2  0 - 99.8 fL  POCT CBC     Status: Abnormal  Collection Time    11/14/13 10:48 AM      Result Value Ref Range   WBC 8.7  4.6 - 10.2 K/uL   Lymph, poc 5.2 (*) 0.6 - 3.4   POC LYMPH PERCENT 60.3 (*) 10 - 50 %L   MID (cbc) 1.2 (*) 0 - 0.9   POC MID % 14.2 (*) 0 - 12 %M   POC Granulocyte 2.2  2 - 6.9   Granulocyte percent 25.5 (*) 37 - 80 %G   RBC 4.70  4.04 - 5.48 M/uL   Hemoglobin 13.7  12.2  - 16.2 g/dL   HCT, POC 16.1  09.6 - 47.9 %   MCV 90.2  80 - 97 fL   MCH, POC 29.1  27 - 31.2 pg   MCHC 32.3  31.8 - 35.4 g/dL   RDW, POC 04.5     Platelet Count, POC 128 (*) 142 - 424 K/uL   MPV 9.8  0 - 99.8 fL  COMPREHENSIVE METABOLIC PANEL     Status: Abnormal   Collection Time    11/14/13 10:50 AM      Result Value Ref Range   Sodium 137  135 - 145 mEq/L   Potassium 4.4  3.5 - 5.3 mEq/L   Chloride 104  96 - 112 mEq/L   CO2 24  19 - 32 mEq/L   Glucose, Bld 80  70 - 99 mg/dL   BUN 10  6 - 23 mg/dL   Creat 4.09  8.11 - 9.14 mg/dL   Total Bilirubin 0.4  0.2 - 1.2 mg/dL   Alkaline Phosphatase 319 (*) 39 - 117 U/L   AST 247 (*) 0 - 37 U/L   ALT 340 (*) 0 - 35 U/L   Total Protein 7.4  6.0 - 8.3 g/dL   Albumin 4.2  3.5 - 5.2 g/dL   Calcium 9.3  8.4 - 78.2 mg/dL  PROTIME-INR     Status: None   Collection Time    11/14/13 11:17 AM      Result Value Ref Range   Prothrombin Time 13.9  11.6 - 15.2 seconds   INR 1.08  <1.50   Comment: The INR is of principal utility in following patients on stable doses     of oral anticoagulants.  The therapeutic range is generally 2.0 to     3.0, but may be 3.0 to 4.0 in patients with mechanical cardiac valves,     recurrent embolisms and antiphospholipid antibodies (including lupus     inhibitors).  HEPATIC FUNCTION PANEL     Status: Abnormal   Collection Time    11/16/13  9:59 AM      Result Value Ref Range   Total Bilirubin 0.4  0.2 - 1.2 mg/dL   Bilirubin, Direct 0.1  0.0 - 0.3 mg/dL   Indirect Bilirubin 0.3  0.2 - 1.2 mg/dL   Alkaline Phosphatase 301 (*) 39 - 117 U/L   AST 242 (*) 0 - 37 U/L   ALT 345 (*) 0 - 35 U/L   Total Protein 7.5  6.0 - 8.3 g/dL   Albumin 3.8  3.5 - 5.2 g/dL  POCT CBC     Status: Abnormal   Collection Time    11/16/13 10:06 AM      Result Value Ref Range   WBC 9.3  4.6 - 10.2 K/uL   Lymph, poc 5.7 (*) 0.6 - 3.4   POC LYMPH PERCENT 61.2 (*) 10 - 50 %L  MID (cbc) 1.2 (*) 0 - 0.9   POC MID % 12.9 (*) 0 - 12  %M   POC Granulocyte 2.4  2 - 6.9   Granulocyte percent 25.9 (*) 37 - 80 %G   RBC 4.68  4.04 - 5.48 M/uL   Hemoglobin 13.6  12.2 - 16.2 g/dL   HCT, POC 78.2  95.6 - 47.9 %   MCV 90.4  80 - 97 fL   MCH, POC 29.1  27 - 31.2 pg   MCHC 32.2  31.8 - 35.4 g/dL   RDW, POC 21.3     Platelet Count, POC 136 (*) 142 - 424 K/uL   MPV 9.3  0 - 99.8 fL    Results for orders placed in visit on 11/23/13  POCT CBC      Result Value Ref Range   WBC 6.3  4.6 - 10.2 K/uL   Lymph, poc 3.7 (*) 0.6 - 3.4   POC LYMPH PERCENT 58.7 (*) 10 - 50 %L   MID (cbc) 0.8  0 - 0.9   POC MID % 12.9 (*) 0 - 12 %M   POC Granulocyte 1.8 (*) 2 - 6.9   Granulocyte percent 28.4 (*) 37 - 80 %G   RBC 4.29  4.04 - 5.48 M/uL   Hemoglobin 12.0 (*) 12.2 - 16.2 g/dL   HCT, POC 08.6  57.8 - 47.9 %   MCV 89.5  80 - 97 fL   MCH, POC 28.0  27 - 31.2 pg   MCHC 31.3 (*) 31.8 - 35.4 g/dL   RDW, POC 46.9     Platelet Count, POC 152  142 - 424 K/uL   MPV 9.5  0 - 99.8 fL    Hepatic function panel and acute hepatitis panel pending. Declines O and P studies at this time.     Assessment & Plan:  23 year old female with mononucleosis, hepatosplenomegaly, lymphocytosis, fatigue -Improving -Supportive care -Await labs -Follow up pending labs -Advance activity as tolerated -Mono precautions to remain in place -Consider ID referral at patient request, however she then declines this -Follow up abdominal US in late summer/fall   Eula Listen, MHS, PA-C [facility] [address] [place], [place] [address] [phone] [facility] 11/23/2013 10:44 AM

## 2013-11-30 ENCOUNTER — Ambulatory Visit (INDEPENDENT_AMBULATORY_CARE_PROVIDER_SITE_OTHER): Payer: Managed Care, Other (non HMO) | Admitting: Physician Assistant

## 2013-11-30 VITALS — BP 118/68 | HR 78 | Temp 98.3°F | Resp 16 | Ht 66.0 in | Wt 201.6 lb

## 2013-11-30 DIAGNOSIS — D72820 Lymphocytosis (symptomatic): Secondary | ICD-10-CM

## 2013-11-30 DIAGNOSIS — R51 Headache: Secondary | ICD-10-CM

## 2013-11-30 DIAGNOSIS — R7989 Other specified abnormal findings of blood chemistry: Secondary | ICD-10-CM

## 2013-11-30 DIAGNOSIS — R5383 Other fatigue: Secondary | ICD-10-CM

## 2013-11-30 DIAGNOSIS — R5381 Other malaise: Secondary | ICD-10-CM

## 2013-11-30 DIAGNOSIS — R945 Abnormal results of liver function studies: Secondary | ICD-10-CM

## 2013-11-30 DIAGNOSIS — B2790 Infectious mononucleosis, unspecified without complication: Secondary | ICD-10-CM

## 2013-11-30 LAB — POCT CBC
Granulocyte percent: 39.7 %G (ref 37–80)
HEMATOCRIT: 39.3 % (ref 37.7–47.9)
Hemoglobin: 12.4 g/dL (ref 12.2–16.2)
LYMPH, POC: 2.2 (ref 0.6–3.4)
MCH, POC: 27.9 pg (ref 27–31.2)
MCHC: 31.6 g/dL — AB (ref 31.8–35.4)
MCV: 88.5 fL (ref 80–97)
MID (cbc): 0.5 (ref 0–0.9)
MPV: 9.1 fL (ref 0–99.8)
POC Granulocyte: 1.8 — AB (ref 2–6.9)
POC LYMPH %: 48.2 % (ref 10–50)
POC MID %: 12.1 %M — AB (ref 0–12)
Platelet Count, POC: 206 10*3/uL (ref 142–424)
RBC: 4.44 M/uL (ref 4.04–5.48)
RDW, POC: 14.4 %
WBC: 4.5 10*3/uL — AB (ref 4.6–10.2)

## 2013-11-30 LAB — COMPREHENSIVE METABOLIC PANEL
ALK PHOS: 109 U/L (ref 39–117)
ALT: 263 U/L — ABNORMAL HIGH (ref 0–35)
AST: 125 U/L — ABNORMAL HIGH (ref 0–37)
Albumin: 4.1 g/dL (ref 3.5–5.2)
BILIRUBIN TOTAL: 0.4 mg/dL (ref 0.2–1.2)
BUN: 14 mg/dL (ref 6–23)
CO2: 25 mEq/L (ref 19–32)
Calcium: 9 mg/dL (ref 8.4–10.5)
Chloride: 105 mEq/L (ref 96–112)
Creat: 0.84 mg/dL (ref 0.50–1.10)
GLUCOSE: 75 mg/dL (ref 70–99)
Potassium: 4.1 mEq/L (ref 3.5–5.3)
Sodium: 139 mEq/L (ref 135–145)
Total Protein: 7.1 g/dL (ref 6.0–8.3)

## 2013-11-30 NOTE — Progress Notes (Signed)
Subjective:    Patient ID: Melanie Crosby, female    DOB: 05-Jan-1991, 24 y.o.   MRN: [id number]  HPI Primary Physician: Iona Hansen, NP  Chief Complaint: Follow up infectious mononucleosis   HPI: 23 y.o. female with history of hepatosplenomegaly of uncertain etiology originally diagnosed October 2014, anxiety, and depression presents for follow up of infectious mononucleosis initially diagnosed 11/12/13 and elevated LFT's.   Today she is "pretty good." She began to feel pretty last Wednesday, 11/21/13. Fatigue has resolved and she feels like her energy is back to baseline. RUQ and LUQ abdominal pain waxes and wanes. No sore throat. She does have a dry mouth. Some congestion and on and off headache. Cough is improved. Afebrile. No chills. Working her usual shifts at work, 12 hour days without issues. Has restarted some of her usual work out routine.    Past Medical History  Diagnosis Date  . Depression   . Anxiety   . Hepatosplenomegaly 03/2013    Uncertain etiology     Home Meds: Prior to Admission medications   Not on File    Allergies: No Known Allergies  History   Social History  . Marital Status: Single    Spouse Name: N/A    Number of Children: N/A  . Years of Education: N/A   Occupational History  . Not on file.   Social History Main Topics  . Smoking status: Never Smoker   . Smokeless tobacco: Not on file  . Alcohol Use: No  . Drug Use: No  . Sexual Activity: Not on file   Other Topics Concern  . Not on file   Social History Narrative  . No narrative on file     Review of Systems  Constitutional: Negative for fever, chills, appetite change and fatigue.  HENT: Positive for congestion. Negative for mouth sores, postnasal drip, rhinorrhea and sore throat.   Respiratory: Positive for cough. Negative for shortness of breath and wheezing.   Gastrointestinal: Positive for abdominal pain and abdominal distention. Negative for nausea, vomiting and diarrhea.    Musculoskeletal: Negative for myalgias, neck pain and neck stiffness.  Skin: Negative for rash.  Neurological: Positive for headaches.       Objective:   Physical Exam  Physical Exam: Blood pressure 118/68, pulse 78, temperature 98.3 F (36.8 C), temperature source Oral, resp. rate 16, height 5\' 6"  (1.676 m), weight 201 lb 9.6 oz (91.445 kg), last menstrual period 11/05/2013, SpO2 98.00%., Body mass index is 32.55 kg/(m^2). General: Well developed, well nourished, in no acute distress. Head: Normocephalic, atraumatic, eyes without discharge, sclera non-icteric, nares are without discharge. Bilateral auditory canals clear, TM's are without perforation, pearly grey and translucent with reflective cone of light bilaterally. Oral cavity moist, posterior pharynx without exudate, erythema, peritonsillar abscess, or post nasal drip. Uvula midline.   Neck: Supple. No thyromegaly. Full ROM. No lymphadenopathy. Lungs: Clear bilaterally to auscultation without wheezes, rales, or rhonchi. Breathing is unlabored. Heart: RRR with S1 S2. No murmurs, rubs, or gallops appreciated. Abdomen: Soft, non-distended with normoactive bowel sounds. Positive for LUQ and RUQ TTP. The LUQ TTP is improved from previous exams. Positive for hepatosplenomegaly. No rebound/guarding. No obvious abdominal masses. Msk:  Strength and tone normal for age. Extremities/Skin: Warm and dry. No clubbing or cyanosis. No edema. No rashes or suspicious lesions. Neuro: Alert and oriented X 3. Moves all extremities spontaneously. Gait is normal. CNII-XII grossly in tact. Psych:  Responds to questions appropriately with a normal affect.  Labs: Recent Results (from the past 2160 hour(s))  CBC WITH DIFFERENTIAL     Status: None   Collection Time    10/23/13 10:43 AM      Result Value Ref Range   WBC 7.7  3.9 - 10.3 10e3/uL   NEUT# 5.6  1.5 - 6.5 10e3/uL   HGB 12.9  11.6 - 15.9 g/dL   HCT 16.138.6  09.634.8 - 04.546.6 %   Platelets 212  145 -  400 10e3/uL   MCV 88.0  79.5 - 101.0 fL   MCH 29.3  25.1 - 34.0 pg   MCHC 33.3  31.5 - 36.0 g/dL   RBC 4.094.38  8.113.70 - 9.145.45 10e6/uL   RDW 13.0  11.2 - 14.5 %   lymph# 1.5  0.9 - 3.3 10e3/uL   MONO# 0.5  0.1 - 0.9 10e3/uL   Eosinophils Absolute 0.0  0.0 - 0.5 10e3/uL   Basophils Absolute 0.0  0.0 - 0.1 10e3/uL   NEUT% 72.9  38.4 - 76.8 %   LYMPH% 19.5  14.0 - 49.7 %   MONO% 6.7  0.0 - 14.0 %   EOS% 0.6  0.0 - 7.0 %   BASO% 0.3  0.0 - 2.0 %  [facility] SMEAR     Status: None   Collection Time    10/23/13 10:43 AM      Result Value Ref Range   Smear Result Smear Available    COMPREHENSIVE METABOLIC PANEL ([facility]13)     Status: None   Collection Time    10/23/13 10:43 AM      Result Value Ref Range   Sodium 142  136 - 145 mEq/L   Potassium 3.9  3.5 - 5.1 mEq/L   Chloride 109  98 - 109 mEq/L   CO2 25  22 - 29 mEq/L   Glucose 83  70 - 140 mg/dl   BUN 78.213.5  7.0 - 95.626.0 mg/dL   Creatinine 0.9  0.6 - 1.1 mg/dL   Total Bilirubin 2.130.33  0.20 - 1.20 mg/dL   Alkaline Phosphatase 62  40 - 150 U/L   AST 17  5 - 34 U/L   ALT 12  0 - 55 U/L   Total Protein 7.5  6.4 - 8.3 g/dL   Albumin 3.9  3.5 - 5.0 g/dL   Calcium 9.8  8.4 - 08.610.4 mg/dL   Anion Gap 8  3 - 11 mEq/L  COMPREHENSIVE METABOLIC PANEL     Status: Abnormal   Collection Time    11/12/13  9:00 AM      Result Value Ref Range   Sodium 136  135 - 145 mEq/L   Potassium 4.4  3.5 - 5.3 mEq/L   Chloride 102  96 - 112 mEq/L   CO2 23  19 - 32 mEq/L   Glucose, Bld 85  70 - 99 mg/dL   BUN 12  6 - 23 mg/dL   Creat 5.780.95  4.690.50 - 6.291.10 mg/dL   Total Bilirubin 0.4  0.2 - 1.2 mg/dL   Alkaline Phosphatase 263 (*) 39 - 117 U/L   AST 264 (*) 0 - 37 U/L   ALT 324 (*) 0 - 35 U/L   Total Protein 7.4  6.0 - 8.3 g/dL   Albumin 4.2  3.5 - 5.2 g/dL   Calcium 9.1  8.4 - 52.810.5 mg/dL  EPSTEIN-BARR VIRUS VCA ANTIBODY PANEL     Status: Abnormal   Collection Time    11/12/13  9:00 AM  Result Value Ref Range   EBV VCA IgG 74.0 (*) <18.0 U/mL   Comment:        Reference Range:       <18.0 U/mL = Negative                        18.0-21.9 U/mL = Equivocal                           >=22.0 U/mL = Positive   EBV VCA IgM >160.0 (*) <36.0 U/mL   Comment:       Reference Range:       <36.0 U/mL = Negative                        36.0-43.9 U/mL = Equivocal                           >=44.0 U/mL = Positive   EBV EA IgG 52.8 (*) <9.0 U/mL   Comment:       Reference Range:        <9.0 U/mL = Negative                         9.0-10.9 U/mL = Equivocal                           >=11.0 U/mL = Positive           Assay cross-reactivity for Early Antigen (EA) has been noted with some     specimens containing antibody to Human Immunodeficiency Virus (HIV).     HIV disease must be excluded before confirmation of EBV diagnosis.     EBV NA IgG 4.5  <18.0 U/mL   Comment:       Reference Range:       <18.0 U/mL = Negative                        18.0-21.9 U/mL = Equivocal                           >=22.0 U/mL = Positive                  Clinical Stage            VCA IgG   VCA IgM      EA    EBV NA                  Susceptibility               -         -         -       -            Very Early Infection        +/-       +/-        -       -            Established Infection        +         +        +/-      -  Recent Infection             +         +        +/-     +/-            Past Infection               +         -        +/-      +                                                                                       +/- means positive or negative (not weak)     High persisting antibody levels may be present in Burkitt's lymphoma     and nasopharyngeal carcinoma.  CMV IGM     Status: None   Collection Time    11/12/13  9:00 AM      Result Value Ref Range   CMV IgM 25.70  <30.00 AU/mL   Comment:       Reference Range:        <30.00 AU/mL = Negative                        30.00-34.99 AU/mL = Equivocal                            >=35.00 AU/mL =  Positive           Results from any one IgM assay should not be used as a sole     determinant of a current or recent infection. Because an IgM test can     yield false positive results and low levels of IgM antibody may     persist for more than 12 months post infection, reliance on a single     test result could be misleading. If an acute infection is suspected,     consider obtaining a new specimen and submit for both IgG and IgM     testing in two or more weeks.        CYTOMEGALOVIRUS ANTIBODY, IGG     Status: None   Collection Time    11/12/13  9:00 AM      Result Value Ref Range   Cytomegalovirus Ab-IgG 0.39  <0.60 U/mL   Comment:       Reference Range:          <0.60 U/mL = Negative                           0.60-0.69 U/mL = Equivocal                              >=0.70 U/mL = Positive            A positive result indicates that the patient has antibodies to CMV.     It does not differentiate between an active or past infection.  The clinical diagnosis must be interpreted in conjunction with the     clinical signs and symptoms of the patient.        POCT RAPID STREP A (OFFICE)     Status: None   Collection Time    11/12/13  9:02 AM      Result Value Ref Range   Rapid Strep A Screen Negative  Negative  CULTURE, GROUP A STREP     Status: None   Collection Time    11/12/13  9:02 AM      Result Value Ref Range   Organism ID, Bacteria Normal Upper Respiratory Flora     Organism ID, Bacteria No Beta Hemolytic Streptococci Isolated    POCT CBC     Status: Abnormal   Collection Time    11/12/13  9:03 AM      Result Value Ref Range   WBC 5.7  4.6 - 10.2 K/uL   Lymph, poc 2.8  0.6 - 3.4   POC LYMPH PERCENT 50.0  10 - 50 %L   MID (cbc) 0.7  0 - 0.9   POC MID % 11.9  0 - 12 %M   POC Granulocyte 2.2  2 - 6.9   Granulocyte percent 38.1  37 - 80 %G   RBC 4.27  4.04 - 5.48 M/uL   Hemoglobin 12.2  12.2 - 16.2 g/dL   HCT, POC 16.138.2  09.637.7 - 47.9 %   MCV 89.4  80 - 97 fL    MCH, POC 28.6  27 - 31.2 pg   MCHC 31.9  31.8 - 35.4 g/dL   RDW, POC 04.513.6     Platelet Count, POC 110 (*) 142 - 424 K/uL   MPV 9.2  0 - 99.8 fL  POCT CBC     Status: Abnormal   Collection Time    11/14/13 10:48 AM      Result Value Ref Range   WBC 8.7  4.6 - 10.2 K/uL   Lymph, poc 5.2 (*) 0.6 - 3.4   POC LYMPH PERCENT 60.3 (*) 10 - 50 %L   MID (cbc) 1.2 (*) 0 - 0.9   POC MID % 14.2 (*) 0 - 12 %M   POC Granulocyte 2.2  2 - 6.9   Granulocyte percent 25.5 (*) 37 - 80 %G   RBC 4.70  4.04 - 5.48 M/uL   Hemoglobin 13.7  12.2 - 16.2 g/dL   HCT, POC 40.942.4  81.137.7 - 47.9 %   MCV 90.2  80 - 97 fL   MCH, POC 29.1  27 - 31.2 pg   MCHC 32.3  31.8 - 35.4 g/dL   RDW, POC 91.413.8     Platelet Count, POC 128 (*) 142 - 424 K/uL   MPV 9.8  0 - 99.8 fL  COMPREHENSIVE METABOLIC PANEL     Status: Abnormal   Collection Time    11/14/13 10:50 AM      Result Value Ref Range   Sodium 137  135 - 145 mEq/L   Potassium 4.4  3.5 - 5.3 mEq/L   Chloride 104  96 - 112 mEq/L   CO2 24  19 - 32 mEq/L   Glucose, Bld 80  70 - 99 mg/dL   BUN 10  6 - 23 mg/dL   Creat 7.820.91  9.560.50 - 2.131.10 mg/dL   Total Bilirubin 0.4  0.2 - 1.2 mg/dL   Alkaline Phosphatase 319 (*) 39 - 117 U/L   AST 247 (*)  0 - 37 U/L   ALT 340 (*) 0 - 35 U/L   Total Protein 7.4  6.0 - 8.3 g/dL   Albumin 4.2  3.5 - 5.2 g/dL   Calcium 9.3  8.4 - 16.1 mg/dL  PROTIME-INR     Status: None   Collection Time    11/14/13 11:17 AM      Result Value Ref Range   Prothrombin Time 13.9  11.6 - 15.2 seconds   INR 1.08  <1.50   Comment: The INR is of principal utility in following patients on stable doses     of oral anticoagulants.  The therapeutic range is generally 2.0 to     3.0, but may be 3.0 to 4.0 in patients with mechanical cardiac valves,     recurrent embolisms and antiphospholipid antibodies (including lupus     inhibitors).  HEPATIC FUNCTION PANEL     Status: Abnormal   Collection Time    11/16/13  9:59 AM      Result Value Ref Range   Total  Bilirubin 0.4  0.2 - 1.2 mg/dL   Bilirubin, Direct 0.1  0.0 - 0.3 mg/dL   Indirect Bilirubin 0.3  0.2 - 1.2 mg/dL   Alkaline Phosphatase 301 (*) 39 - 117 U/L   AST 242 (*) 0 - 37 U/L   ALT 345 (*) 0 - 35 U/L   Total Protein 7.5  6.0 - 8.3 g/dL   Albumin 3.8  3.5 - 5.2 g/dL  POCT CBC     Status: Abnormal   Collection Time    11/16/13 10:06 AM      Result Value Ref Range   WBC 9.3  4.6 - 10.2 K/uL   Lymph, poc 5.7 (*) 0.6 - 3.4   POC LYMPH PERCENT 61.2 (*) 10 - 50 %L   MID (cbc) 1.2 (*) 0 - 0.9   POC MID % 12.9 (*) 0 - 12 %M   POC Granulocyte 2.4  2 - 6.9   Granulocyte percent 25.9 (*) 37 - 80 %G   RBC 4.68  4.04 - 5.48 M/uL   Hemoglobin 13.6  12.2 - 16.2 g/dL   HCT, POC 09.6  04.5 - 47.9 %   MCV 90.4  80 - 97 fL   MCH, POC 29.1  27 - 31.2 pg   MCHC 32.2  31.8 - 35.4 g/dL   RDW, POC 40.9     Platelet Count, POC 136 (*) 142 - 424 K/uL   MPV 9.3  0 - 99.8 fL  HEPATIC FUNCTION PANEL     Status: Abnormal   Collection Time    11/23/13  9:37 AM      Result Value Ref Range   Total Bilirubin 0.4  0.2 - 1.2 mg/dL   Bilirubin, Direct 0.1  0.0 - 0.3 mg/dL   Indirect Bilirubin 0.3  0.2 - 1.2 mg/dL   Alkaline Phosphatase 182 (*) 39 - 117 U/L   AST 261 (*) 0 - 37 U/L   ALT 340 (*) 0 - 35 U/L   Total Protein 7.1  6.0 - 8.3 g/dL   Albumin 3.7  3.5 - 5.2 g/dL  ACUTE HEP PANEL AND HEP B SURFACE AB     Status: Abnormal   Collection Time    11/23/13  9:37 AM      Result Value Ref Range   Hepatitis B Surface Ag NEGATIVE  NEGATIVE   Hep B C IgM NON REACTIVE  NON REACTIVE   Comment: High  levels of Hepatitis B Core IgM antibody are detectable     during the acute stage of Hepatitis B. This antibody is used     to differentiate current from past HBV infection.         Hep B S Ab POS (*) NEGATIVE   Hep A IgM NON REACTIVE  NON REACTIVE   HCV Ab NEGATIVE  NEGATIVE  POCT CBC     Status: Abnormal   Collection Time    11/23/13  9:42 AM      Result Value Ref Range   WBC 6.3  4.6 - 10.2 K/uL    Lymph, poc 3.7 (*) 0.6 - 3.4   POC LYMPH PERCENT 58.7 (*) 10 - 50 %L   MID (cbc) 0.8  0 - 0.9   POC MID % 12.9 (*) 0 - 12 %M   POC Granulocyte 1.8 (*) 2 - 6.9   Granulocyte percent 28.4 (*) 37 - 80 %G   RBC 4.29  4.04 - 5.48 M/uL   Hemoglobin 12.0 (*) 12.2 - 16.2 g/dL   HCT, POC 09.8  11.9 - 47.9 %   MCV 89.5  80 - 97 fL   MCH, POC 28.0  27 - 31.2 pg   MCHC 31.3 (*) 31.8 - 35.4 g/dL   RDW, POC 14.7     Platelet Count, POC 152  142 - 424 K/uL   MPV 9.5  0 - 99.8 fL   Results for orders placed in visit on 11/30/13  POCT CBC      Result Value Ref Range   WBC 4.5 (*) 4.6 - 10.2 K/uL   Lymph, poc 2.2  0.6 - 3.4   POC LYMPH PERCENT 48.2  10 - 50 %L   MID (cbc) 0.5  0 - 0.9   POC MID % 12.1 (*) 0 - 12 %M   POC Granulocyte 1.8 (*) 2 - 6.9   Granulocyte percent 39.7  37 - 80 %G   RBC 4.44  4.04 - 5.48 M/uL   Hemoglobin 12.4  12.2 - 16.2 g/dL   HCT, POC 82.9  56.2 - 47.9 %   MCV 88.5  80 - 97 fL   MCH, POC 27.9  27 - 31.2 pg   MCHC 31.6 (*) 31.8 - 35.4 g/dL   RDW, POC 13.0     Platelet Count, POC 206  142 - 424 K/uL   MPV 9.1  0 - 99.8 fL     CMP pending    Assessment & Plan:  23 year old female with mononucleosis, hepatosplenomegaly, resolved lymphocytosis, and resolved fatigue -Improving -Supportive care -She has a planned follow abdominal US in November 2015. If her labs and physical exam dictate, consider moving this up to around September 2015. I would only move this up if the patient's physical exam is unremarkable, otherwise keep same [place] and follow up with GI.  -Follow up pending CMP, likely 2 weeks -Continue to advance activity as tolerated    Eula Listen, MHS, PA-C [facility] [address] [place], [place] [address] [phone] [facility] 11/30/2013 10:35 AM

## 2013-12-14 ENCOUNTER — Other Ambulatory Visit (INDEPENDENT_AMBULATORY_CARE_PROVIDER_SITE_OTHER): Payer: Managed Care, Other (non HMO) | Admitting: Radiology

## 2013-12-14 DIAGNOSIS — B2790 Infectious mononucleosis, unspecified without complication: Secondary | ICD-10-CM

## 2013-12-14 DIAGNOSIS — R1011 Right upper quadrant pain: Secondary | ICD-10-CM

## 2013-12-14 LAB — COMPREHENSIVE METABOLIC PANEL
ALBUMIN: 4.4 g/dL (ref 3.5–5.2)
ALT: 36 U/L — ABNORMAL HIGH (ref 0–35)
AST: 31 U/L (ref 0–37)
Alkaline Phosphatase: 68 U/L (ref 39–117)
BUN: 12 mg/dL (ref 6–23)
CALCIUM: 9.1 mg/dL (ref 8.4–10.5)
CO2: 25 mEq/L (ref 19–32)
Chloride: 107 mEq/L (ref 96–112)
Creat: 0.84 mg/dL (ref 0.50–1.10)
GLUCOSE: 81 mg/dL (ref 70–99)
POTASSIUM: 4.1 meq/L (ref 3.5–5.3)
Sodium: 140 mEq/L (ref 135–145)
TOTAL PROTEIN: 7.4 g/dL (ref 6.0–8.3)
Total Bilirubin: 0.5 mg/dL (ref 0.2–1.2)

## 2013-12-14 NOTE — Progress Notes (Signed)
Pt here for labs only. 

## 2014-01-22 ENCOUNTER — Ambulatory Visit (INDEPENDENT_AMBULATORY_CARE_PROVIDER_SITE_OTHER): Payer: Managed Care, Other (non HMO) | Admitting: Psychology

## 2014-01-22 DIAGNOSIS — F331 Major depressive disorder, recurrent, moderate: Secondary | ICD-10-CM

## 2014-01-22 IMAGING — US US ABDOMEN COMPLETE
1 series · 14 of 25 positions shown · non-contrast
Comparison: Ultrasound 04/19/2013

CLINICAL DATA: Hepatosplenomegaly

EXAM:
ULTRASOUND ABDOMEN COMPLETE

[Series 1: us abdomen complete · 0.35mm/px · 14 of 70 slices shown]
[im 1/70]
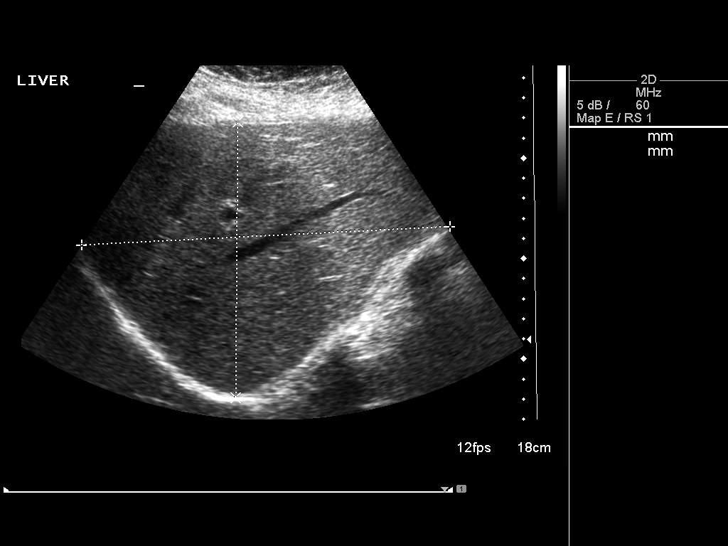
[im 6/70]
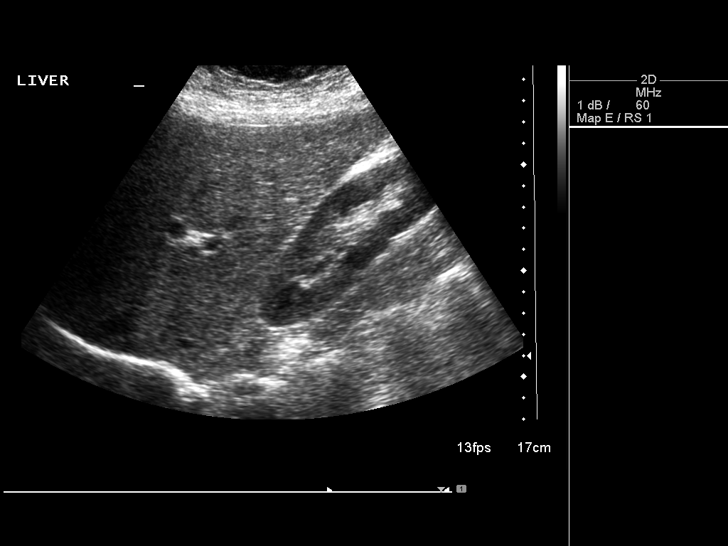
[im 12/70]
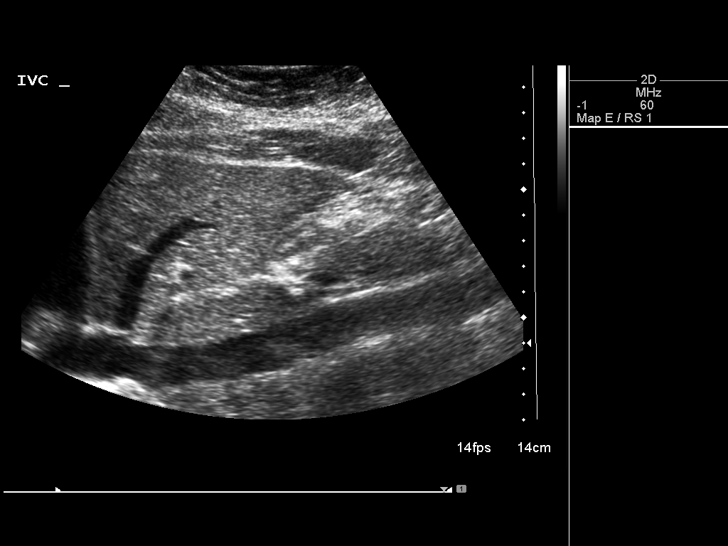
[im 18/70]
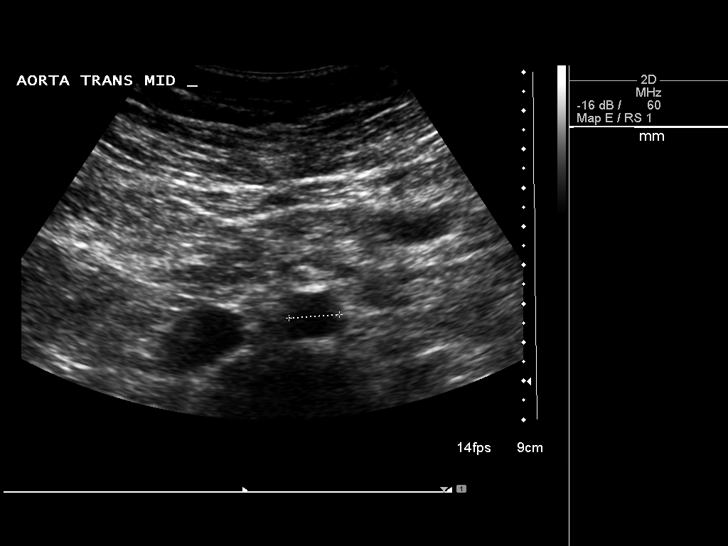
[im 24/70]
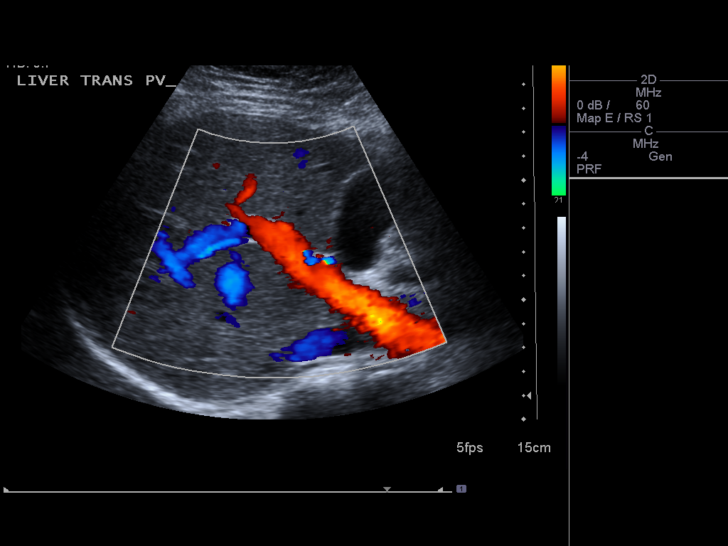
[im 26/70]
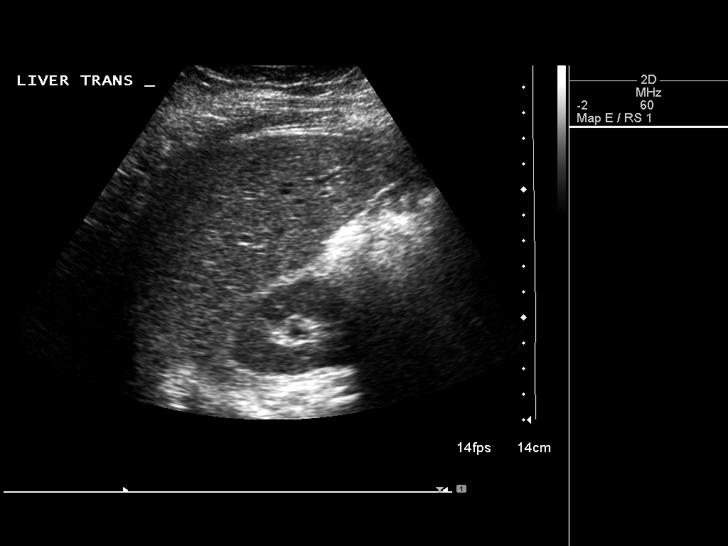
[im 32/70]
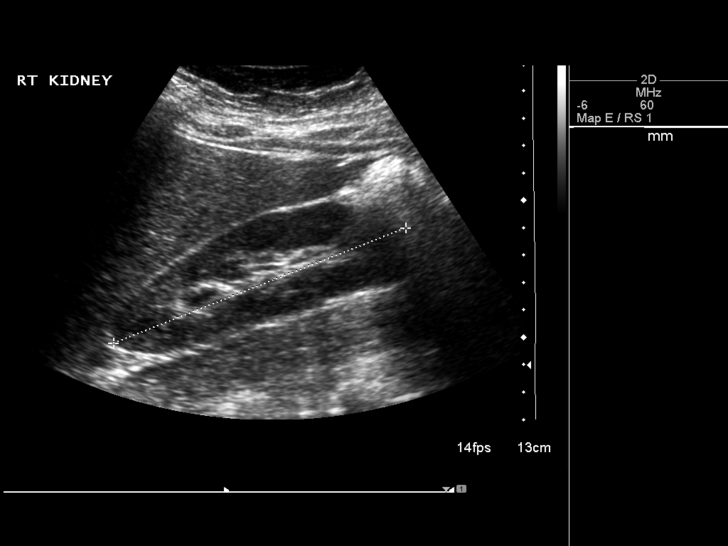
[im 38/70]
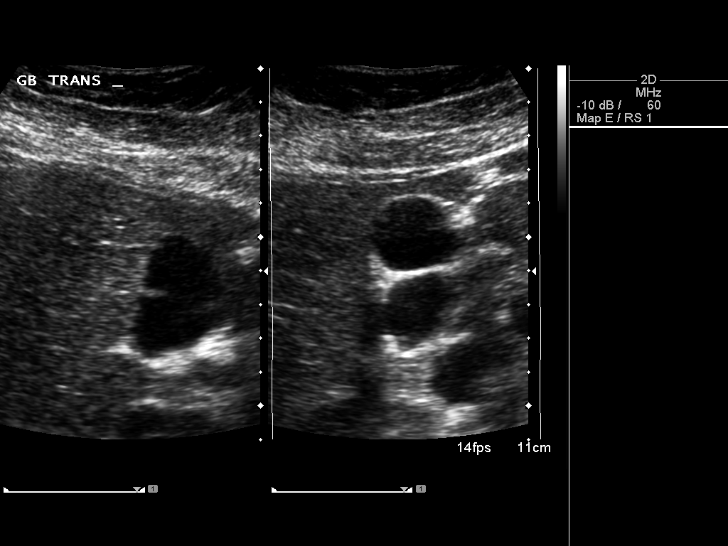
[im 44/70]
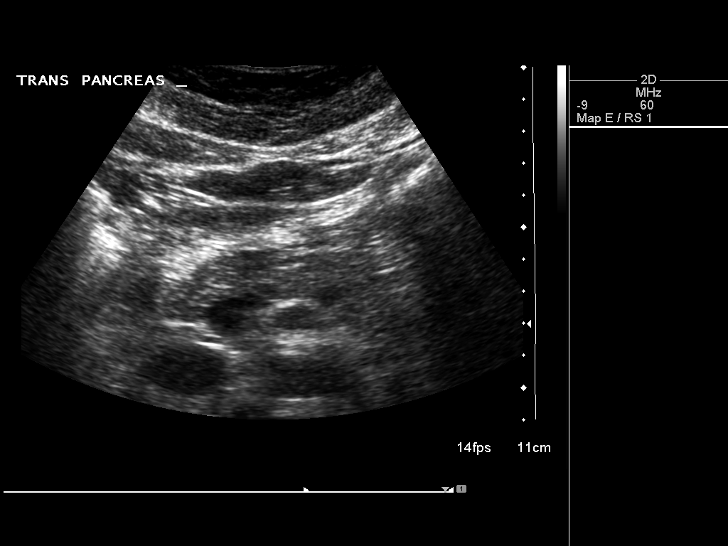
[im 47/70]
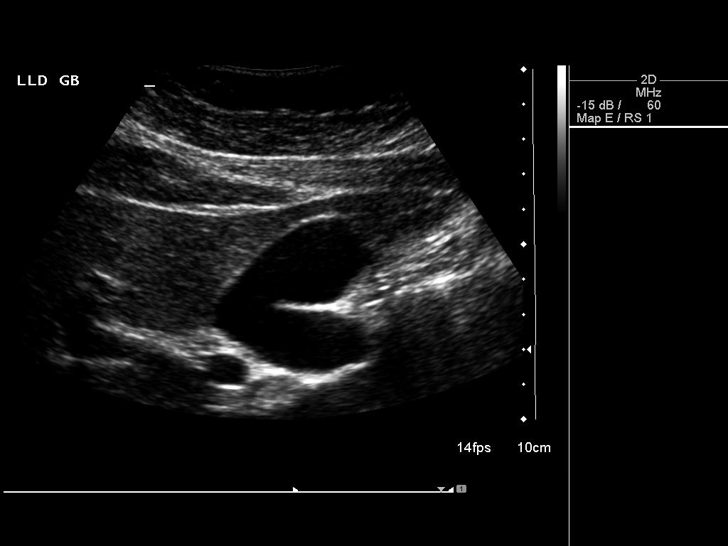
[im 52/70]
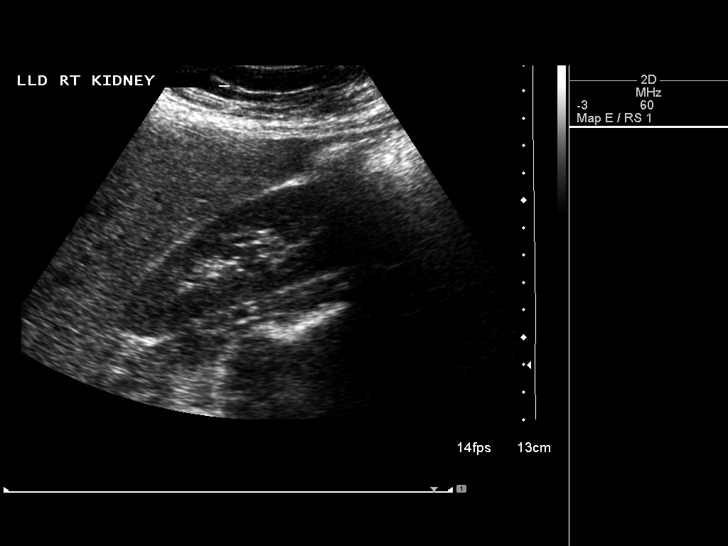
[im 58/70]
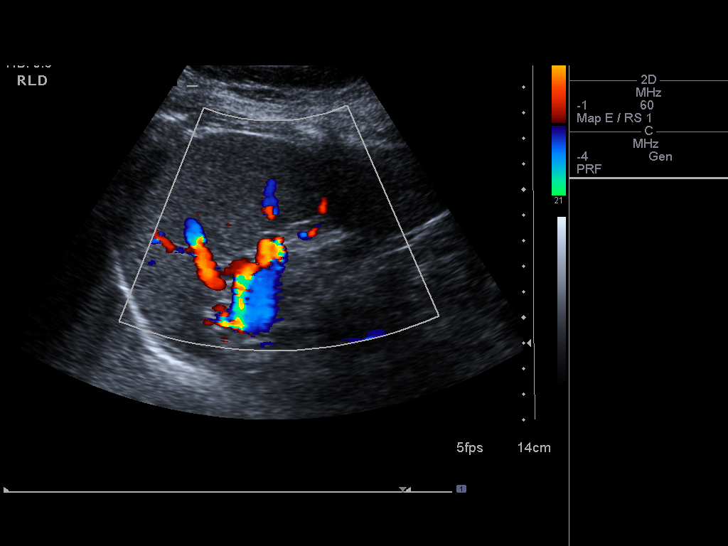
[im 64/70]
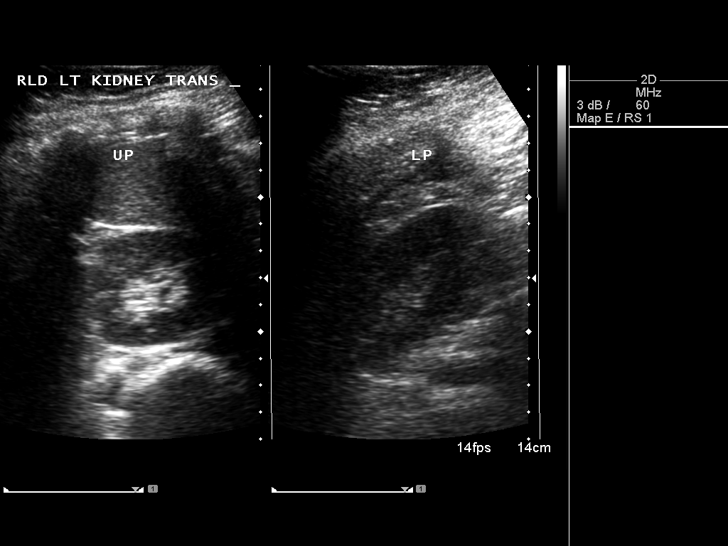
[im 70/70]
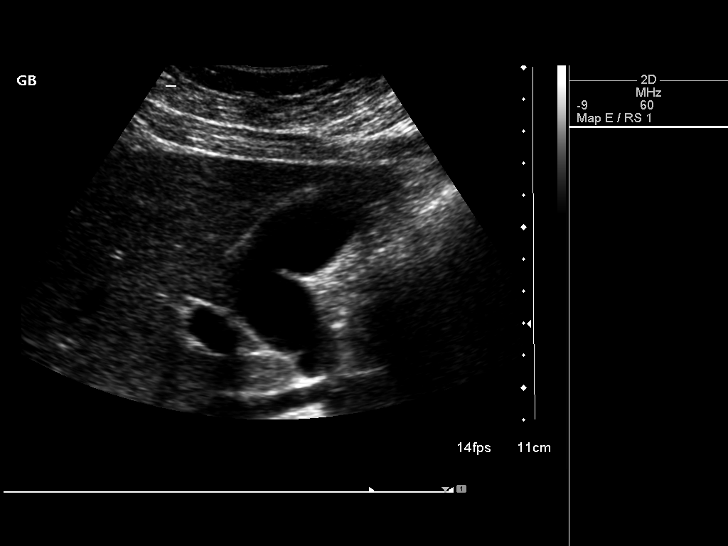

[14 of 25 positions shown; findings below may reference images not displayed]

FINDINGS: Gallbladder:

No gallstones or wall thickening visualized. No sonographic Murphy
sign noted.

Common bile duct:

Diameter: 2.2 mm

Liver:

No focal liver lesion. Liver measures 17.6 cm in length which is
similar to the prior study. Echogenicity is within normal limits.

IVC:

No abnormality visualized.

Pancreas:

Visualized portion unremarkable.

Spleen:

14 x 14.5 x 5.7 cm. Splenic volume 600 mL, unchanged from the prior
study.

Right Kidney:

Length: 11.5 cm.. Echogenicity within normal limits. No mass or
hydronephrosis visualized.

Left Kidney:

Length: 12.9 cm. Echogenicity within normal limits. No mass or
hydronephrosis visualized.

Abdominal aorta:

No aneurysm visualized.

Other findings:

None.
IMPRESSION: Hepatosplenomegaly, unchanged from the prior study. No focal liver
lesion.

## 2014-01-25 ENCOUNTER — Ambulatory Visit (INDEPENDENT_AMBULATORY_CARE_PROVIDER_SITE_OTHER): Payer: Managed Care, Other (non HMO) | Admitting: Emergency Medicine

## 2014-01-25 ENCOUNTER — Ambulatory Visit (INDEPENDENT_AMBULATORY_CARE_PROVIDER_SITE_OTHER): Payer: Managed Care, Other (non HMO) | Admitting: Gynecology

## 2014-01-25 ENCOUNTER — Encounter: Payer: Self-pay | Admitting: Gynecology

## 2014-01-25 VITALS — BP 122/74 | HR 90 | Temp 98.7°F | Resp 17 | Ht 67.0 in | Wt 202.0 lb

## 2014-01-25 VITALS — BP 114/72 | HR 82 | Resp 16 | Ht 66.0 in | Wt 202.0 lb

## 2014-01-25 DIAGNOSIS — Z124 Encounter for screening for malignant neoplasm of cervix: Secondary | ICD-10-CM

## 2014-01-25 DIAGNOSIS — R162 Hepatomegaly with splenomegaly, not elsewhere classified: Secondary | ICD-10-CM

## 2014-01-25 DIAGNOSIS — K7689 Other specified diseases of liver: Secondary | ICD-10-CM

## 2014-01-25 DIAGNOSIS — T148XXA Other injury of unspecified body region, initial encounter: Secondary | ICD-10-CM

## 2014-01-25 DIAGNOSIS — Z01419 Encounter for gynecological examination (general) (routine) without abnormal findings: Secondary | ICD-10-CM

## 2014-01-25 DIAGNOSIS — R161 Splenomegaly, not elsewhere classified: Secondary | ICD-10-CM

## 2014-01-25 DIAGNOSIS — R160 Hepatomegaly, not elsewhere classified: Secondary | ICD-10-CM

## 2014-01-25 LAB — POCT CBC
Granulocyte percent: 63.9 %G (ref 37–80)
HEMATOCRIT: 41.6 % (ref 37.7–47.9)
HEMOGLOBIN: 13.2 g/dL (ref 12.2–16.2)
LYMPH, POC: 1.9 (ref 0.6–3.4)
MCH, POC: 27.7 pg (ref 27–31.2)
MCHC: 31.7 g/dL — AB (ref 31.8–35.4)
MCV: 87.4 fL (ref 80–97)
MID (cbc): 0.4 (ref 0–0.9)
MPV: 7.6 fL (ref 0–99.8)
POC GRANULOCYTE: 4 (ref 2–6.9)
POC LYMPH PERCENT: 29.5 %L (ref 10–50)
POC MID %: 6.6 %M (ref 0–12)
Platelet Count, POC: 183 10*3/uL (ref 142–424)
RBC: 4.75 M/uL (ref 4.04–5.48)
RDW, POC: 14.3 %
WBC: 6.3 10*3/uL (ref 4.6–10.2)

## 2014-01-25 LAB — POCT SEDIMENTATION RATE: POCT SED RATE: 12 mm/h (ref 0–22)

## 2014-01-25 NOTE — Progress Notes (Signed)
23 y.o. Single Caucasian female   [id number] here for annual exam. Patient has never been sexually active.  Pt reports cycles are regular and light since she started exercising, lost 15#.  Pt has had oral sex in the past, no interest in contraception at this time.  Pt being evaluated for HSM unknown etiology, she has noticed multiple bruises without trauma.  Patient's last menstrual period was 01/02/2014.          Sexually active: No.  The current method of family planning is abstinence.    Exercising: Yes.    bike every other day Last pap: never had one  Alcohol: no Tobacco: no Drugs: no Gardisil: not sure , completed:  Labs: Zoe Lan, MD   Health Maintenance  Topic Date Due  . Pap Smear  10/29/2007  . Tetanus/tdap  10/28/2008  . Influenza Vaccine  01/19/2014    Family History  Problem Relation Age of Onset  . Multiple sclerosis Mother   . Heart disease Maternal Grandfather   . Breast cancer Maternal Aunt     Mid 50's  . Skin cancer Maternal Grandfather   . Diabetes Maternal Grandmother   . Hypertension Father   . Thyroid disease Mother     Patient Active Problem List   Diagnosis Date Noted  . Hepatomegaly 05/28/2013    Past Medical History  Diagnosis Date  . Depression   . Anxiety   . Hepatosplenomegaly 03/2013    Uncertain etiology    Past Surgical History  Procedure Laterality Date  . Appendectomy  2013    Allergies: Review of patient's allergies indicates no known allergies.  Current Outpatient Prescriptions  Medication Sig Dispense Refill  . Milk Thistle 150 MG CAPS Take by mouth daily.      . vitamin E 400 UNIT capsule Take by mouth daily.       No current facility-administered medications for this visit.    ROS: Pertinent items are noted in HPI.  Exam:    BP 114/72  Pulse 82  Resp 16  Ht 5\' 6"  (1.676 m)  Wt 202 lb (91.627 kg)  BMI 32.62 kg/m2  LMP 01/02/2014 Weight change: @WEIGHTCHANGE @ Last 3 height recordings:  Ht Readings from Last  3 Encounters:  01/25/14 5\' 6"  (1.676 m)  11/30/13 5\' 6"  (1.676 m)  11/23/13 5\' 6"  (1.676 m)   General appearance: alert, cooperative and appears stated age Head: Normocephalic, without obvious abnormality, atraumatic Neck: no adenopathy, no carotid bruit, no JVD, supple, symmetrical, trachea midline and thyroid not enlarged, symmetric, no tenderness/mass/nodules Lungs: clear to auscultation bilaterally Breasts: normal appearance, no masses or tenderness Heart: regular rate and rhythm, S1, S2 normal, no murmur, click, rub or gallop Abdomen: soft, non-tender; bowel sounds normal; no masses,  no organomegaly Extremities: extremities normal, atraumatic, no cyanosis or edema Skin: Skin color, texture, turgor normal. No rashes or lesions Lymph nodes: Cervical, supraclavicular, and axillary nodes normal. no inguinal nodes palpated Neurologic: Grossly normal   Pelvic: External genitalia:  no lesions              Urethra: normal appearing urethra with no masses, tenderness or lesions              Bartholins and Skenes: Bartholin's, Urethra, Skene's normal                 Vagina: normal appearing vagina with normal color and discharge, no lesions              Cervix: normal  appearance              Pap taken: Yes.          Bimanual Exam:  Uterus:  uterus is normal size, shape, consistency and nontender                                      Adnexa:    normal adnexa in size, nontender and no masses                                      Rectovaginal: Deferred                                      Anus:  defer exam   1. Routine gynecological examination  counseled on breast self exam, STD prevention, HIV risk factors and prevention, adequate intake of calcium and vitamin D return annually or prn Discussed STD prevention, regular condom use. Discussed HPV vaccine risks and benefits, pt  does not know give consent, will confirm whether she received at home. LFT's should return to normal before  vaccine   2. Screening for cervical cancer Role of HPV in cervical dysplasia reviewed, oral HPV discussed - PAP with Reflex to HPV (IPS)  3. Bruising Recommend pt f/u with MD re HSM, last CBC 8255m ago, bruising new - CBC with Differential   An After Visit Summary was printed and given to the patient.

## 2014-01-25 NOTE — Progress Notes (Addendum)
   Subjective:    Patient ID: Melanie Crosby, female    DOB: [birth year], 23 y.o.   MRN: [id number] This chart was scribed for Collene GobbleSteven A Makeya Hilgert, MD by Julian HyMorgan Graham, ED Scribe. The patient was seen in Room 10. The patient's care was started at 12:49 PM.   Chief Complaint  Patient presents with  . LAbs    patient states her Gyno requested her to have labs done     HPI HPI Comments: Melanie PennaKayla Rottenberg is a 23 y.o. female who presents to the [facility] for blood work due to hepatosplenomegaly. Pt reports she is having an enlarged liver and new bruises on her upper extremities and was recommended to get a CBC by her OB/GYN this morning. Pt also reports some LUQ abdominal pain. Pt reports she saw Dr. Clelia CroftShaw and then Dr. Alycia Rossettiyan. Pt reports her liver test were abnormal when she had mononucleosis. Pt reports she feels fully recovered from the mononucleosis. Pt reports she had a previously enlarged spleen and an enlarged liver. Pt reports she also [facility] to [facility] her symptoms but he denies any cancer. Pt states she got an ultrasound and was told to have a repeat scan in November 2015.  Review of Systems  Constitutional: Negative for fever and chills.  Gastrointestinal: Positive for abdominal pain (LUQ). Negative for nausea and vomiting.  Hematological: Bruises/bleeds easily.   Objective:   Physical Exam CONSTITUTIONAL: Well developed/well nourished HEAD: Normocephalic/atraumatic EYES: EOMI/PERRL ENMT: Mucous membranes moist NECK: supple no meningeal signs SPINE:entire spine nontender CV: S1/S2 noted, no murmurs/rubs/gallops noted LUNGS: Lungs are clear to auscultation bilaterally, no apparent distress ABDOMEN: soft, nontender, no rebound or guarding. Spleen is down about 2 cm, tender to touch and she has RUQ and LUQ tenderness to touch. GU:no cva tenderness NEURO: Pt is awake/alert, moves all extremitiesx4 EXTREMITIES: pulses normal, full  ROM SKIN: warm, color normal. PSYCH: no abnormalities of mood noted  Filed Vitals:   01/25/14 1230  BP: 122/74  Pulse: 90  Temp: 98.7 F (37.1 C)  TempSrc: Oral  Resp: 17  Height: 5\' 7"  (1.702 m)  Weight: 202 lb (91.627 kg)  SpO2: 96%   Results for orders placed in visit on 01/25/14  POCT CBC      Result Value Ref Range   WBC 6.3  4.6 - 10.2 K/uL   Lymph, poc 1.9  0.6 - 3.4   POC LYMPH PERCENT 29.5  10 - 50 %L   MID (cbc) 0.4  0 - 0.9   POC MID % 6.6  0 - 12 %M   POC Granulocyte 4.0  2 - 6.9   Granulocyte percent 63.9  37 - 80 %G   RBC 4.75  4.04 - 5.48 M/uL   Hemoglobin 13.2  12.2 - 16.2 g/dL   HCT, POC 40.941.6  81.137.7 - 47.9 %   MCV 87.4  80 - 97 fL   MCH, POC 27.7  27 - 31.2 pg   MCHC 31.7 (*) 31.8 - 35.4 g/dL   RDW, POC 91.414.3     Platelet Count, POC 183  142 - 424 K/uL   MPV 7.6  0 - 99.8 fL     Assessment & Plan:  12:55 PM- Patient informed of current plan for treatment and evaluation and agrees with plan at this time. Awaiting lab results. We'll also schedule an ultrasound followup of hepatosplenomegaly. Her last spleen length was 13 cm.

## 2014-01-25 NOTE — Patient Instructions (Signed)
Make aapt with [place] and f/u re enlarged liver and spleen

## 2014-01-26 LAB — HEPATIC FUNCTION PANEL
ALT: 21 U/L (ref 0–35)
AST: 23 U/L (ref 0–37)
Albumin: 4.6 g/dL (ref 3.5–5.2)
Alkaline Phosphatase: 49 U/L (ref 39–117)
BILIRUBIN TOTAL: 0.4 mg/dL (ref 0.2–1.2)
Bilirubin, Direct: 0.1 mg/dL (ref 0.0–0.3)
Indirect Bilirubin: 0.3 mg/dL (ref 0.2–1.2)
TOTAL PROTEIN: 7.6 g/dL (ref 6.0–8.3)

## 2014-01-26 LAB — APTT: APTT: 32 s (ref 24–37)

## 2014-01-26 LAB — PROTIME-INR
INR: 1.02 (ref <1.50)
PROTHROMBIN TIME: 13.4 s (ref 11.6–15.2)

## 2014-01-29 LAB — IPS PAP TEST WITH REFLEX TO HPV

## 2014-01-30 ENCOUNTER — Ambulatory Visit (INDEPENDENT_AMBULATORY_CARE_PROVIDER_SITE_OTHER): Payer: Managed Care, Other (non HMO) | Admitting: Family Medicine

## 2014-01-30 VITALS — BP 122/74 | HR 109 | Temp 99.1°F | Resp 17 | Ht 66.0 in | Wt 201.0 lb

## 2014-01-30 DIAGNOSIS — R509 Fever, unspecified: Secondary | ICD-10-CM

## 2014-01-30 DIAGNOSIS — R5383 Other fatigue: Principal | ICD-10-CM

## 2014-01-30 DIAGNOSIS — N10 Acute tubulo-interstitial nephritis: Secondary | ICD-10-CM

## 2014-01-30 DIAGNOSIS — R5381 Other malaise: Secondary | ICD-10-CM

## 2014-01-30 DIAGNOSIS — R112 Nausea with vomiting, unspecified: Secondary | ICD-10-CM

## 2014-01-30 DIAGNOSIS — R109 Unspecified abdominal pain: Secondary | ICD-10-CM

## 2014-01-30 DIAGNOSIS — R1030 Lower abdominal pain, unspecified: Secondary | ICD-10-CM

## 2014-01-30 LAB — POCT CBC
Granulocyte percent: 76.4 %G (ref 37–80)
HCT, POC: 37.5 % — AB (ref 37.7–47.9)
Hemoglobin: 12.5 g/dL (ref 12.2–16.2)
LYMPH, POC: 1.6 (ref 0.6–3.4)
MCH: 28.9 pg (ref 27–31.2)
MCHC: 33.3 g/dL (ref 31.8–35.4)
MCV: 86.7 fL (ref 80–97)
MID (CBC): 0.7 (ref 0–0.9)
MPV: 7.2 fL (ref 0–99.8)
PLATELET COUNT, POC: 157 10*3/uL (ref 142–424)
POC Granulocyte: 7.3 — AB (ref 2–6.9)
POC LYMPH %: 16.6 % (ref 10–50)
POC MID %: 7 %M (ref 0–12)
RBC: 4.32 M/uL (ref 4.04–5.48)
RDW, POC: 13.9 %
WBC: 9.6 10*3/uL (ref 4.6–10.2)

## 2014-01-30 LAB — POCT UA - MICROSCOPIC ONLY
CRYSTALS, UR, HPF, POC: NEGATIVE
Casts, Ur, LPF, POC: NEGATIVE
MUCUS UA: NEGATIVE
Yeast, UA: NEGATIVE

## 2014-01-30 LAB — COMPREHENSIVE METABOLIC PANEL
ALBUMIN: 4.4 g/dL (ref 3.5–5.2)
ALT: 20 U/L (ref 0–35)
AST: 30 U/L (ref 0–37)
Alkaline Phosphatase: 50 U/L (ref 39–117)
BUN: 13 mg/dL (ref 6–23)
CALCIUM: 9.2 mg/dL (ref 8.4–10.5)
CHLORIDE: 104 meq/L (ref 96–112)
CO2: 23 meq/L (ref 19–32)
Creat: 0.81 mg/dL (ref 0.50–1.10)
Glucose, Bld: 86 mg/dL (ref 70–99)
Potassium: 4.2 mEq/L (ref 3.5–5.3)
Sodium: 136 mEq/L (ref 135–145)
Total Bilirubin: 0.4 mg/dL (ref 0.2–1.2)
Total Protein: 7.1 g/dL (ref 6.0–8.3)

## 2014-01-30 LAB — POCT URINALYSIS DIPSTICK
Bilirubin, UA: NEGATIVE
Glucose, UA: NEGATIVE
KETONES UA: NEGATIVE
Nitrite, UA: NEGATIVE
PH UA: 5.5
PROTEIN UA: NEGATIVE
SPEC GRAV UA: 1.025
Urobilinogen, UA: 0.2

## 2014-01-30 LAB — POCT URINE PREGNANCY: Preg Test, Ur: NEGATIVE

## 2014-01-30 MED ORDER — CEFTRIAXONE SODIUM 1 G IJ SOLR
1.0000 g | Freq: Once | INTRAMUSCULAR | Status: AC
  Administered 2014-01-30: 1 g via INTRAMUSCULAR

## 2014-01-30 MED ORDER — CIPROFLOXACIN HCL 500 MG PO TABS: 500.0000 mg | ORAL_TABLET | Freq: Two times a day (BID) | ORAL | Status: DC

## 2014-01-30 NOTE — Progress Notes (Signed)
[facility] [address] [place] [address] [phone]  Date:  01/30/2014   Name:  Melanie Crosby   DOB:  [birth year]   MRN:  [id number]  PCP:  Iona Hansen, NP    Chief Complaint: Nausea, Generalized Body Aches and Fever   History of Present Illness:  Melanie Crosby is a 23 y.o. very pleasant female patient who presents with the following:  She had mono in June of this year, and then was noted to have hepatosplenomegaly which is being followed.  She did get back to normal following her episode of mono- she is frustrated to be sick again!  She is here today with 48 hours of body aches, lower abdominal pain, nausea, headache, chills. Today is Wednesday- sx started on Monday.  She just went to work for 2 hours yesterday, she felt bad and had to go homoe.  Last night she took some advil and felt better, was able to eat dinner and went to bed.  She thought she might be ok, but then today her sx returned with body aches and nuasea again. She has vomited once on Monday.  She is having diarrhea.   She had appendicitis in 2013- her cramping and pain on Monday reminded her of the time.  "I almost went to the hospital."  Her sx are no longer as severe No suspicious foods.   She had not been aware of a fever, but had felt hot and cold at home.    No urinary sx.   No cough, no ST.   No new sexual partners - in fact she has never been SA.  She just had a GYN exam at her OB office last week with normal pap result  Patient Active Problem List   Diagnosis Date Noted  . Hepatomegaly 05/28/2013  . Hepatosplenomegaly 03/21/2013    Past Medical History  Diagnosis Date  . Depression   . Anxiety   . Hepatosplenomegaly 03/2013    Uncertain etiology    Past Surgical History  Procedure Laterality Date  . Appendectomy  2013    History  Substance Use Topics  . Smoking status: Never Smoker   . Smokeless tobacco: Never Used  . Alcohol Use: No    Family History   Problem Relation Age of Onset  . Multiple sclerosis Mother   . Heart disease Maternal Grandfather   . Breast cancer Maternal Aunt     Mid 50's  . Skin cancer Maternal Grandfather   . Diabetes Maternal Grandmother   . Hypertension Father   . Thyroid disease Mother     No Known Allergies  Medication list has been reviewed and updated.  Current Outpatient Prescriptions on File Prior to Visit  Medication Sig Dispense Refill  . Milk Thistle 150 MG CAPS Take by mouth daily.      . vitamin E 400 UNIT capsule Take by mouth daily.       No current facility-administered medications on file prior to visit.    Review of Systems:  As per HPI- otherwise negative.   Physical Examination: Filed Vitals:   01/30/14 1407  BP: 122/74  Pulse: 109  Temp: 100 F (37.8 C)  Resp: 17   Filed Vitals:   01/30/14 1407  Height: 5\' 6"  (1.676 m)  Weight: 201 lb (91.173 kg)   Body mass index is 32.46 kg/(m^2). Ideal Body Weight: Weight in (lb) to have BMI = 25: 154.6  GEN: WDWN, NAD, Non-toxic, A &  O x 3, overweight, looks well HEENT: Atraumatic, Normocephalic. Neck supple. No masses, No LAD.  Bilateral TM wnl, oropharynx normal.  PEERL,EOMI.   Ears and Nose: No external deformity. CV: RRR, No M/G/R. No JVD. No thrill. No extra heart sounds. PULM: CTA B, no wheezes, crackles, rhonchi. No retractions. No resp. distress. No accessory muscle use. ABD: S, ND, +BS. No rebound. No HSM.  Vague abdominal tenderness which does not localize to any particular area.  EXTR: No c/c/e NEURO Normal gait.  PSYCH: Normally interactive. Conversant. Not depressed or anxious appearing.  Calm demeanor.  Gu:  She is quite tender with vaginal exam (she states this is baseline for her; she has never been SA.  "they use a numbing cream at my OB office for my pap")  Normal vaginal canal and cervix.  Removed speculum and she was able to tolerate BME ok- no CMT, no adnexal masses or tenderness.  Results for orders placed  in visit on 01/30/14  POCT CBC      Result Value Ref Range   WBC 9.6  4.6 - 10.2 K/uL   Lymph, poc 1.6  0.6 - 3.4   POC LYMPH PERCENT 16.6  10 - 50 %L   MID (cbc) 0.7  0 - 0.9   POC MID % 7.0  0 - 12 %M   POC Granulocyte 7.3 (*) 2 - 6.9   Granulocyte percent 76.4  37 - 80 %G   RBC 4.32  4.04 - 5.48 M/uL   Hemoglobin 12.5  12.2 - 16.2 g/dL   HCT, POC 16.1 (*) 09.6 - 47.9 %   MCV 86.7  80 - 97 fL   MCH, POC 28.9  27 - 31.2 pg   MCHC 33.3  31.8 - 35.4 g/dL   RDW, POC 04.5     Platelet Count, POC 157  142 - 424 K/uL   MPV 7.2  0 - 99.8 fL  POCT UA - MICROSCOPIC ONLY      Result Value Ref Range   WBC, Ur, HPF, POC 10-15     RBC, urine, microscopic 1-3     Bacteria, U Microscopic 1+     Mucus, UA neg     Epithelial cells, urine per micros 5-7     Crystals, Ur, HPF, POC neg     Casts, Ur, LPF, POC neg     Yeast, UA neg    POCT URINALYSIS DIPSTICK      Result Value Ref Range   Color, UA yellow     Clarity, UA clear     Glucose, UA neg     Bilirubin, UA neg     Ketones, UA neg     Spec Grav, UA 1.025     Blood, UA trace-intact     pH, UA 5.5     Protein, UA neg     Urobilinogen, UA 0.2     Nitrite, UA neg     Leukocytes, UA small (1+)    POCT URINE PREGNANCY      Result Value Ref Range   Preg Test, Ur Negative      Assessment and Plan: Other malaise and fatigue - Plan: POCT CBC  Fever, unspecified - Plan: cefTRIAXone (ROCEPHIN) injection 1 g  Nausea and vomiting, vomiting of unspecified type - Plan: POCT UA - Microscopic Only, POCT urinalysis dipstick, POCT urine pregnancy, Comprehensive metabolic panel, Urine culture  Lower abdominal pain - Plan: GC/Chlamydia Probe Amp  Acute pyelonephritis - Plan: ciprofloxacin (CIPRO) 500  MG tablet  Suspect that her sx are caused by UTI/ pyelo.  Treated with Igm of IM rocephin here at clinic, and will start PO cipro.  Await urine culture.  At this time her tenderness is minimal and she has no adnexal discomfort.  Doubt an ovarian  torsion, but discussed this with her- if her severe pain comes back she will go to the ED for immediate assessment.  Plan to recheck tomorrow if not better, Friday if better.    Signed Abbe AmsterdamJessica Raela Bohl, MD

## 2014-01-30 NOTE — Patient Instructions (Signed)
Rest and drink plenty of fluids.  You can start the cipro tonight.  We are working off the likelihood that you have a urinary tract/ kidney infection at this time. However, if your symptoms get very bad again this evening please do go to the ER.  Otherwise let's plan to recheck you tomorrow or Friday depending on how you are doing. If you are NOT better come back tomorrow.

## 2014-01-31 LAB — GC/CHLAMYDIA PROBE AMP
CT PROBE, AMP APTIMA: NEGATIVE
GC Probe RNA: NEGATIVE

## 2014-01-31 LAB — URINE CULTURE
COLONY COUNT: NO GROWTH
Organism ID, Bacteria: NO GROWTH

## 2014-02-01 ENCOUNTER — Ambulatory Visit (INDEPENDENT_AMBULATORY_CARE_PROVIDER_SITE_OTHER): Payer: Managed Care, Other (non HMO) | Admitting: Family Medicine

## 2014-02-01 VITALS — BP 112/78 | HR 85 | Temp 98.1°F | Resp 16 | Ht 65.5 in | Wt 201.0 lb

## 2014-02-01 DIAGNOSIS — R109 Unspecified abdominal pain: Secondary | ICD-10-CM

## 2014-02-01 NOTE — Progress Notes (Signed)
[facility] [address], [place] [address] [phone]  Date:  02/01/2014   Name:  Melanie Crosby   DOB:  [birth year]   MRN:  [id number]  PCP:  Iona Hansen, NP    Chief Complaint: Follow-up   History of Present Illness:  Melanie Crosby is a 23 y.o. very pleasant female patient who presents with the following:  Here today to follow-up from visit 2 days ago- she was seen with abdominal discomfort and suspected pyelo.  She was tx with rocephin and PO cipro.  She reports that yesterday she felt a little better, but still did not have an appetite. Today she continues to improve- she was able to go to work, and she is eating some.  Her appetite is still not good but she was able to eat cereal and a salad for lunch.   She does not have any abdominal pain- however she has started her menses and does have some cramps. Her menses started yesterday.  No urinary sx, no further fever.  Overall she is much improved  Results for orders placed in visit on 01/30/14  URINE CULTURE      Result Value Ref Range   Colony Count NO GROWTH     Organism ID, Bacteria NO GROWTH    GC/CHLAMYDIA PROBE AMP      Result Value Ref Range   CT Probe RNA NEGATIVE     GC Probe RNA NEGATIVE    COMPREHENSIVE METABOLIC PANEL      Result Value Ref Range   Sodium 136  135 - 145 mEq/L   Potassium 4.2  3.5 - 5.3 mEq/L   Chloride 104  96 - 112 mEq/L   CO2 23  19 - 32 mEq/L   Glucose, Bld 86  70 - 99 mg/dL   BUN 13  6 - 23 mg/dL   Creat 3.24  4.01 - 0.27 mg/dL   Total Bilirubin 0.4  0.2 - 1.2 mg/dL   Alkaline Phosphatase 50  39 - 117 U/L   AST 30  0 - 37 U/L   ALT 20  0 - 35 U/L   Total Protein 7.1  6.0 - 8.3 g/dL   Albumin 4.4  3.5 - 5.2 g/dL   Calcium 9.2  8.4 - 25.3 mg/dL  POCT CBC      Result Value Ref Range   WBC 9.6  4.6 - 10.2 K/uL   Lymph, poc 1.6  0.6 - 3.4   POC LYMPH PERCENT 16.6  10 - 50 %L   MID (cbc) 0.7  0 - 0.9   POC MID % 7.0  0 - 12 %M   POC Granulocyte 7.3 (*) 2 -  6.9   Granulocyte percent 76.4  37 - 80 %G   RBC 4.32  4.04 - 5.48 M/uL   Hemoglobin 12.5  12.2 - 16.2 g/dL   HCT, POC 66.4 (*) 40.3 - 47.9 %   MCV 86.7  80 - 97 fL   MCH, POC 28.9  27 - 31.2 pg   MCHC 33.3  31.8 - 35.4 g/dL   RDW, POC 47.4     Platelet Count, POC 157  142 - 424 K/uL   MPV 7.2  0 - 99.8 fL  POCT UA - MICROSCOPIC ONLY      Result Value Ref Range   WBC, Ur, HPF, POC 10-15     RBC, urine, microscopic 1-3     Bacteria, U Microscopic 1+  Mucus, UA neg     Epithelial cells, urine per micros 5-7     Crystals, Ur, HPF, POC neg     Casts, Ur, LPF, POC neg     Yeast, UA neg    POCT URINALYSIS DIPSTICK      Result Value Ref Range   Color, UA yellow     Clarity, UA clear     Glucose, UA neg     Bilirubin, UA neg     Ketones, UA neg     Spec Grav, UA 1.025     Blood, UA trace-intact     pH, UA 5.5     Protein, UA neg     Urobilinogen, UA 0.2     Nitrite, UA neg     Leukocytes, UA small (1+)    POCT URINE PREGNANCY      Result Value Ref Range   Preg Test, Ur Negative       Patient Active Problem List   Diagnosis Date Noted  . Hepatomegaly 05/28/2013  . Hepatosplenomegaly 03/21/2013    Past Medical History  Diagnosis Date  . Depression   . Anxiety   . Hepatosplenomegaly 03/2013    Uncertain etiology    Past Surgical History  Procedure Laterality Date  . Appendectomy  2013    History  Substance Use Topics  . Smoking status: Never Smoker   . Smokeless tobacco: Never Used  . Alcohol Use: No    Family History  Problem Relation Age of Onset  . Multiple sclerosis Mother   . Heart disease Maternal Grandfather   . Breast cancer Maternal Aunt     Mid 50's  . Skin cancer Maternal Grandfather   . Diabetes Maternal Grandmother   . Hypertension Father   . Thyroid disease Mother     No Known Allergies  Medication list has been reviewed and updated.  Current Outpatient Prescriptions on File Prior to Visit  Medication Sig Dispense Refill  .  ciprofloxacin (CIPRO) 500 MG tablet Take 1 tablet (500 mg total) by mouth 2 (two) times daily.  20 tablet  0  . Milk Thistle 150 MG CAPS Take by mouth daily.      . vitamin E 400 UNIT capsule Take by mouth daily.       No current facility-administered medications on file prior to visit.    Review of Systems:  As per HPI- otherwise negative.   Physical Examination: Filed Vitals:   02/01/14 1716  BP: 112/78  Pulse: 85  Temp: 98.1 F (36.7 C)  Resp: 16   Filed Vitals:   02/01/14 1716  Height: 5' 5.5" (1.664 m)  Weight: 201 lb (91.173 kg)   Body mass index is 32.93 kg/(m^2). Ideal Body Weight: Weight in (lb) to have BMI = 25: 152.2  GEN: WDWN, NAD, Non-toxic, A & O x 3, overweight, looks well HEENT: Atraumatic, Normocephalic. Neck supple. No masses, No LAD. Ears and Nose: No external deformity. CV: RRR, No M/G/R. No JVD. No thrill. No extra heart sounds. PULM: CTA B, no wheezes, crackles, rhonchi. No retractions. No resp. distress. No accessory muscle use. ABD: S, ND, +BS. No rebound. No HSM.  Benign exam except for mild RUQ pain- per her report this has been present since she first had hepatomegaly last year.   EXTR: No c/c/e NEURO Normal gait.  PSYCH: Normally interactive. Conversant. Not depressed or anxious appearing.  Calm demeanor.    Assessment and Plan: Abdominal pain, unspecified site  She is much  better today, although her urine culture was negative after all.  Recent US showed no gallstones.  She will have a repeat US in early September, and her LFTs are back to normal.   See patient instructions for more details.   She will also try to get a copy of her CT scan from when she had appendicitis in 2013- this may help us see her baseline liver size   Signed Abbe AmsterdamJessica Copland, MD

## 2014-02-01 NOTE — Patient Instructions (Signed)
I am glad that you are feeling better!  If your symptoms come back again please let me know right away . Otherwise let's plan to have your next ultrasound in September and take it from there.  If you could get a copy of your CT scan from [place] that would be helpful

## 2014-02-07 ENCOUNTER — Ambulatory Visit (INDEPENDENT_AMBULATORY_CARE_PROVIDER_SITE_OTHER): Payer: Managed Care, Other (non HMO) | Admitting: Psychology

## 2014-02-07 DIAGNOSIS — F4323 Adjustment disorder with mixed anxiety and depressed mood: Secondary | ICD-10-CM

## 2014-02-21 ENCOUNTER — Ambulatory Visit
Admission: RE | Admit: 2014-02-21 | Discharge: 2014-02-21 | Disposition: A | Discharge status: Home / Self Care | Payer: Managed Care, Other (non HMO) | Source: Ambulatory Visit | Attending: Emergency Medicine | Admitting: Emergency Medicine

## 2014-02-21 ENCOUNTER — Ambulatory Visit (INDEPENDENT_AMBULATORY_CARE_PROVIDER_SITE_OTHER): Payer: Managed Care, Other (non HMO) | Admitting: Psychology

## 2014-02-21 DIAGNOSIS — R160 Hepatomegaly, not elsewhere classified: Secondary | ICD-10-CM

## 2014-02-21 DIAGNOSIS — R161 Splenomegaly, not elsewhere classified: Secondary | ICD-10-CM

## 2014-02-21 DIAGNOSIS — F331 Major depressive disorder, recurrent, moderate: Secondary | ICD-10-CM

## 2014-03-07 ENCOUNTER — Ambulatory Visit (INDEPENDENT_AMBULATORY_CARE_PROVIDER_SITE_OTHER): Payer: Managed Care, Other (non HMO) | Admitting: Psychology

## 2014-03-07 DIAGNOSIS — F331 Major depressive disorder, recurrent, moderate: Secondary | ICD-10-CM

## 2014-03-21 ENCOUNTER — Ambulatory Visit (INDEPENDENT_AMBULATORY_CARE_PROVIDER_SITE_OTHER): Payer: Managed Care, Other (non HMO) | Admitting: Psychology

## 2014-03-21 DIAGNOSIS — F332 Major depressive disorder, recurrent severe without psychotic features: Secondary | ICD-10-CM

## 2014-04-18 ENCOUNTER — Ambulatory Visit (INDEPENDENT_AMBULATORY_CARE_PROVIDER_SITE_OTHER): Payer: Managed Care, Other (non HMO) | Admitting: Psychology

## 2014-04-18 DIAGNOSIS — F332 Major depressive disorder, recurrent severe without psychotic features: Secondary | ICD-10-CM

## 2014-04-26 ENCOUNTER — Encounter: Payer: Self-pay | Admitting: Family Medicine

## 2014-04-26 ENCOUNTER — Ambulatory Visit (INDEPENDENT_AMBULATORY_CARE_PROVIDER_SITE_OTHER): Payer: Managed Care, Other (non HMO) | Admitting: Family Medicine

## 2014-04-26 VITALS — BP 114/72 | HR 73 | Temp 98.7°F | Resp 16 | Ht 66.0 in | Wt 205.0 lb

## 2014-04-26 DIAGNOSIS — R162 Hepatomegaly with splenomegaly, not elsewhere classified: Secondary | ICD-10-CM

## 2014-04-26 DIAGNOSIS — L720 Epidermal cyst: Secondary | ICD-10-CM

## 2014-04-26 NOTE — Progress Notes (Signed)
Subjective:    Patient ID: Melanie Crosby, female    DOB: [birth year], 23 y.o.   MRN: [id number] Chief Complaint  Patient presents with  . Advice Only    about liver    HPI  23 year old female with PMH listed below is here to establish primary care with Dr. Delman Crosby.  She met Dr. Brigitte Crosby at [facility] about a year ago.  Since then, she was diagnosed with hepatosplenomegaly and followed with [facility].  She has no concerns or complaints at this time.  She denies any abnormal fatigue, no change in skin, disorientation, red blotches on the skin, jaundice, tremulousness, melena, or constipation.  Also denies diarrhea, nausea, vomiting, palpitations, abnormal anxiety, or bradycardia.  She is concerned about this unexplained hepatosplenomegaly that has become apparent since her move to [place] from [place].  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    She tries to maintain a diet of cereal and salads at lunch, but within the last few months, she is eating to go ([employer]--chicken in a salad bowl).  She exercises at home on an exercise bike about 4x/week on for 40 minutes/30 kilometers.  She denies any fatigue or dyspnea, that interrupts these workouts.    She has also submitted a CT Scan from her appendectomy that she had in 2013 in [place], which may show some insight as to whether this spleen and liver enlargement is a new finding.  The hepatosplenomegaly was diagnosed before the bout of Mono in the fall.         With discussion of any autoimmune illnesses and other illnesses in family, patient is unsure of father's side of the family.  Father has HTN.  Mother has multiple sclerosis, and a recent thyroidectomy of unknown reason. Maternal grandfather had heart disease.  Brother had gallbladder disease s/p cholecystectomy.     Diet:  Follow up with GI next week.   Past Medical  History  Diagnosis Date  . Depression   . Anxiety   . Hepatosplenomegaly 38/4536    Uncertain etiology    Review of Systems ROS otherwise unremarkable unless stated above.      Objective:   Physical Exam  Constitutional: She appears well-developed and well-nourished. No distress.  HENT:  Head: Normocephalic and atraumatic.  Eyes: Conjunctivae are normal. Pupils are equal, round, and reactive to light. No scleral icterus.  Neck:    Cardiovascular: Normal rate, regular rhythm, normal heart sounds and intact distal pulses.  Exam reveals no gallop and no friction rub.   No murmur heard. Pulmonary/Chest: Effort normal and breath sounds normal. No respiratory distress. She has no wheezes.  Abdominal: There is splenomegaly. There is tenderness in the right upper quadrant and left upper quadrant. There is positive Murphy's sign. There is no rigidity, no rebound and no guarding.  Skin: No bruising, no petechiae and no rash noted. No erythema.  No jaundice, spider angioma, palmar erythema.  Psychiatric: She has a normal mood and affect. Her speech is normal and behavior is normal. Judgment and thought content normal. Cognition and memory are normal.          Assessment & Plan:  24 year old female is here today to establish primary care with Dr. Delman Crosby  Hepatosplenomegaly While her last liver panel was normal, she continues to have to have significant tenderness along the liver and  spleen borders on physical exam. And the cause of her organomegaly is still unexplained.  She has been seeing [facility] for months with no current answers to this phenomenon before and after viral compromise.  She has also submitted a CT Scan from her appendectomy that she had in 2013 in [place], which may show some insight as to the growth of the liver and spleen.   -Referring to [facility] with special attention from Liver Specialist appreciated for tertiary care. -Will obtain [facility] clinic consult notes     Epidermal Cyst of Neck -Appears cystic, mobile and firm, non-painful.  Less lymphatic suspicion.  Patient will keep observation of character this cyst with next visit.       Ivar Drape, PA-C [facility] 11/6/20155:41 PM

## 2014-04-29 ENCOUNTER — Telehealth: Payer: Self-pay | Admitting: Radiology

## 2014-04-29 NOTE — Telephone Encounter (Signed)
Patient is to have an US done, I have called radiology, she has outside disk from [place], which Dr Clelia CroftShaw wants compared to the upcoming US. The person I spoke to advised me to mail the disk so the radiologist can compare, I think this would be difficult to locate, I will call back later and speak to Erskine SquibbJane, she has helped me in the pas, I want to make sure I get the disk to the correct place.

## 2014-05-02 ENCOUNTER — Ambulatory Visit (INDEPENDENT_AMBULATORY_CARE_PROVIDER_SITE_OTHER): Payer: Managed Care, Other (non HMO) | Admitting: Psychology

## 2014-05-02 DIAGNOSIS — F332 Major depressive disorder, recurrent severe without psychotic features: Secondary | ICD-10-CM

## 2014-05-03 ENCOUNTER — Encounter: Payer: Self-pay | Admitting: Family Medicine

## 2014-05-03 NOTE — Telephone Encounter (Signed)
[facility] /

## 2014-05-07 NOTE — Progress Notes (Signed)
Hepatosplenomegaly - Plan: Ambulatory referral to Gastroenterology for second opinion at a tertiary care center.  Epidermal cyst of neck    Pt assessed, reviewed documentation and agree w/ assessment and plan. Norberto SorensonEva Leiya Keesey, MD MPH

## 2014-05-22 ENCOUNTER — Telehealth: Payer: Self-pay

## 2014-05-22 NOTE — Telephone Encounter (Signed)
Pt vm has not been set up. Will try again

## 2014-05-27 NOTE — Telephone Encounter (Signed)
VM is not set up.  Sending letter to address in chart  Encounter closed

## 2014-05-31 IMAGING — US US ABDOMEN COMPLETE
1 series · 14 of 25 positions shown · non-contrast
Comparison: 05/25/2013

CLINICAL DATA: Hepatosplenomegaly

EXAM:
ULTRASOUND ABDOMEN COMPLETE

[Series 1: us abdomen complete · 0.39mm/px · 14 of 71 slices shown]
[im 1/71]
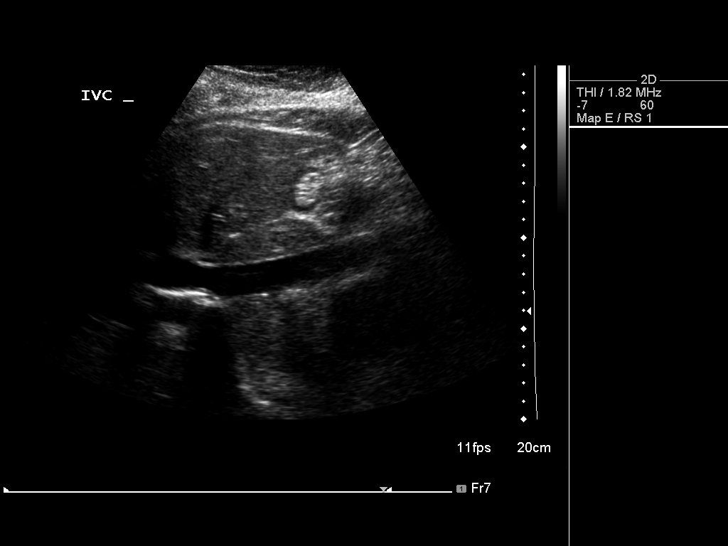
[im 6/71]
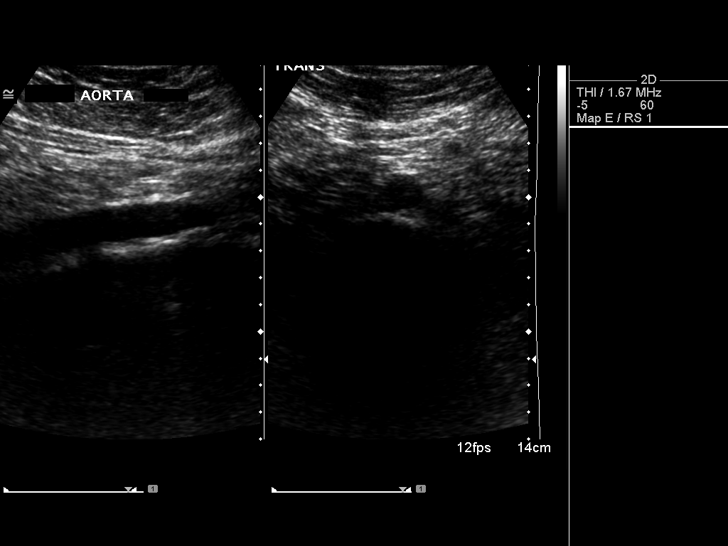
[im 12/71]
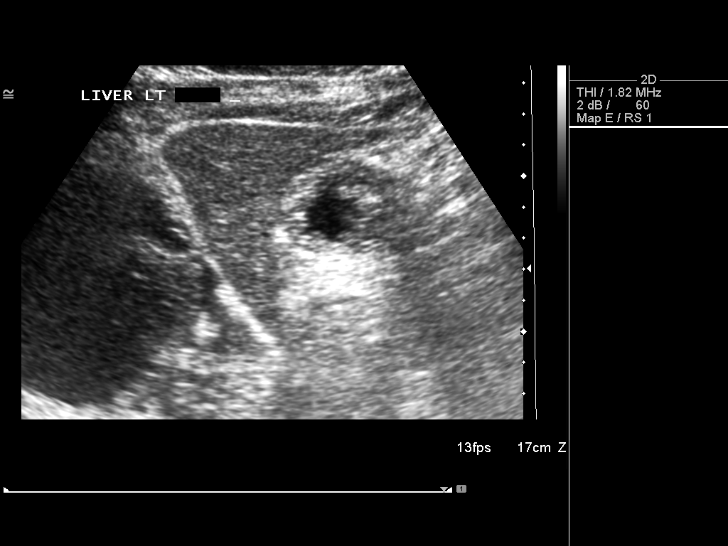
[im 18/71]
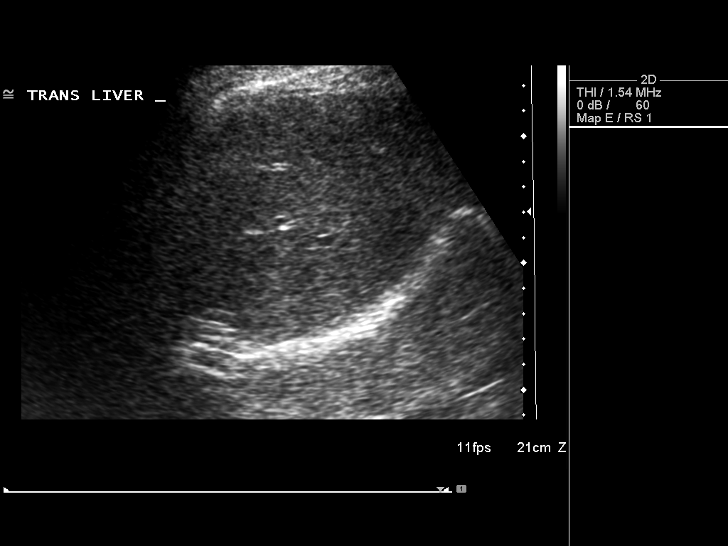
[im 24/71]
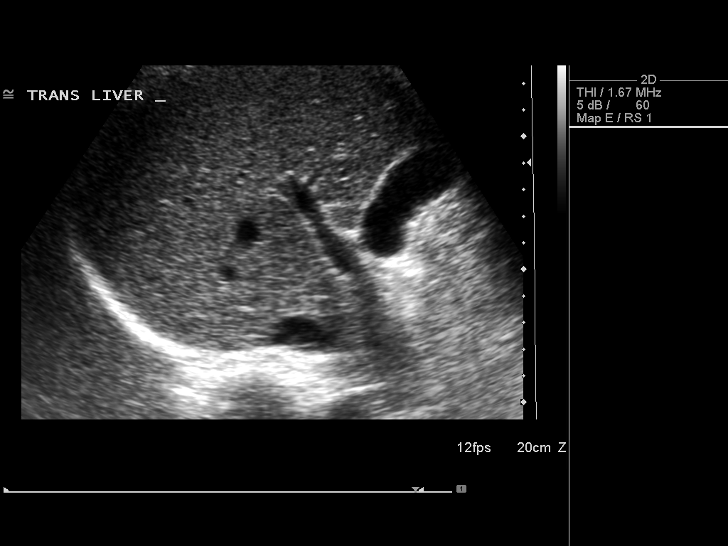
[im 27/71]
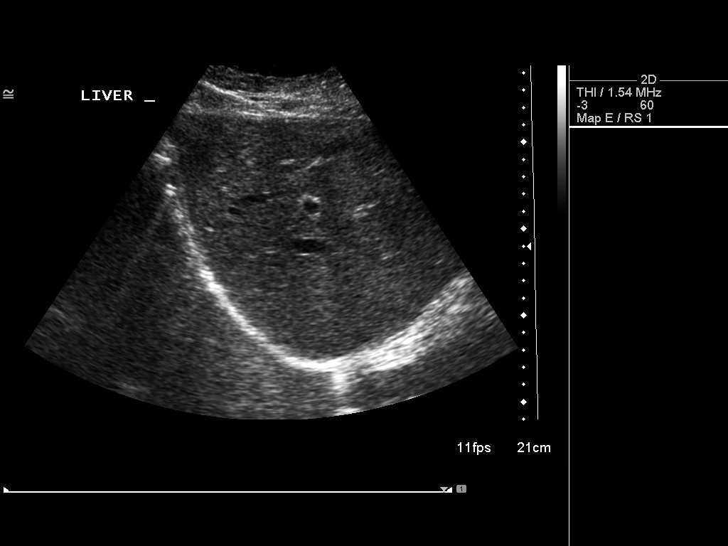
[im 33/71]
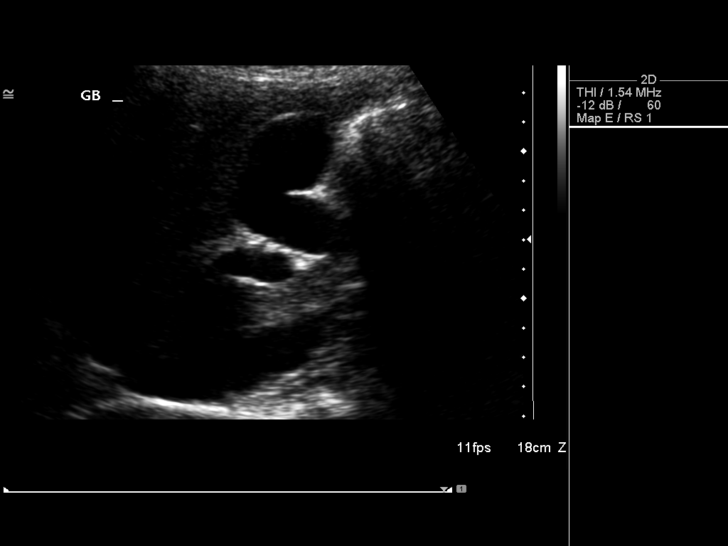
[im 38/71]
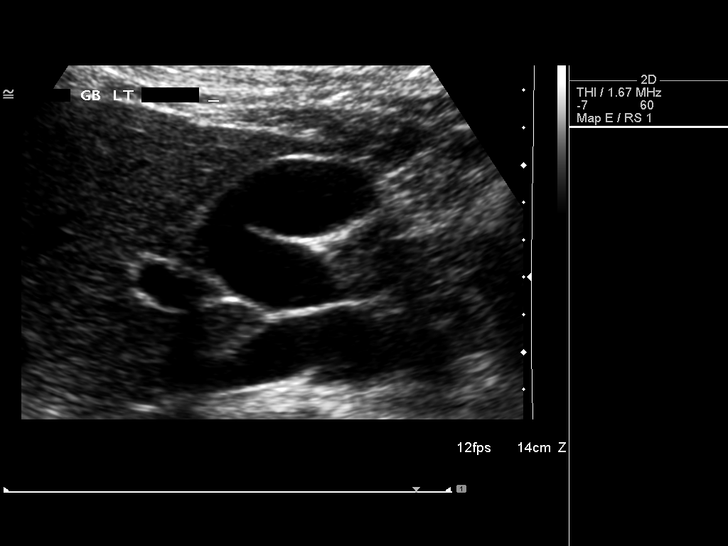
[im 44/71]
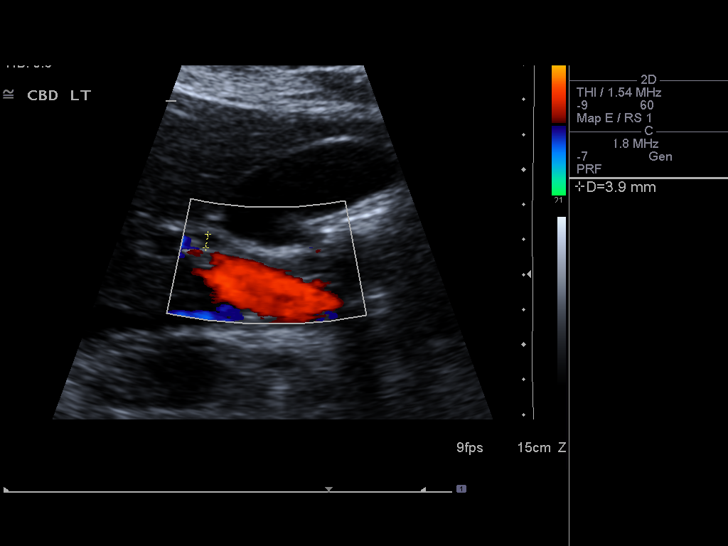
[im 47/71]
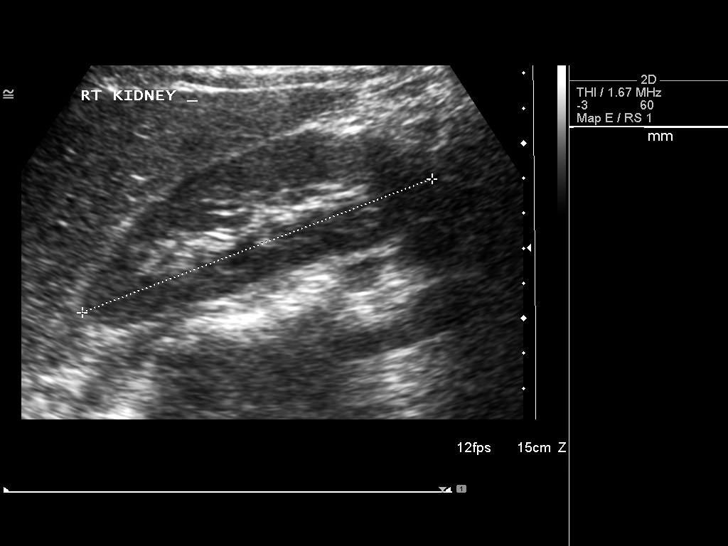
[im 53/71]
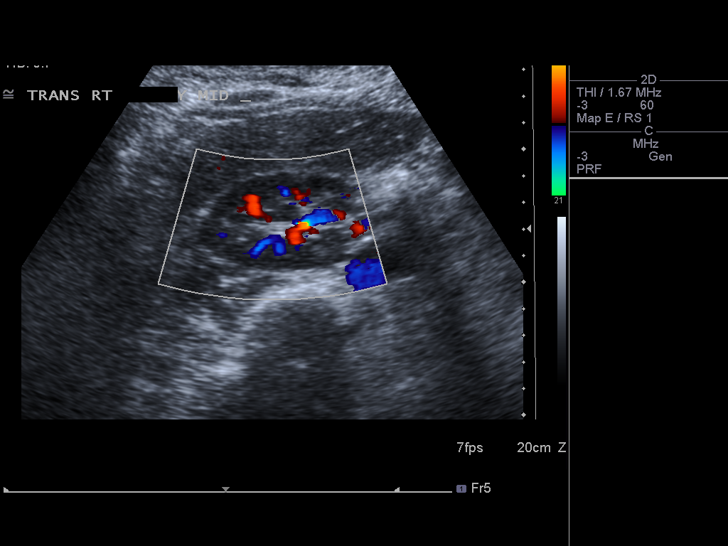
[im 59/71]
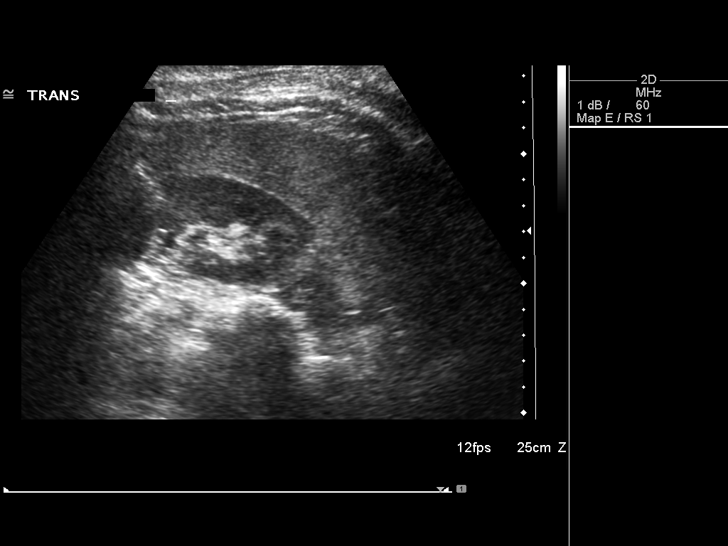
[im 65/71]
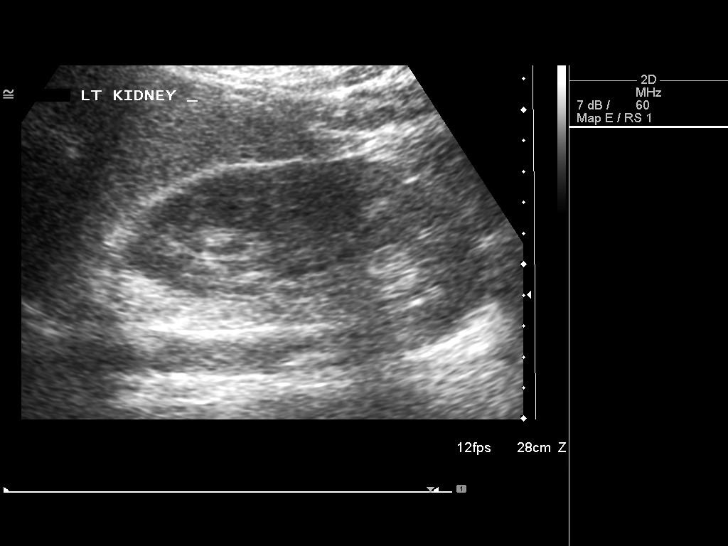
[im 71/71]
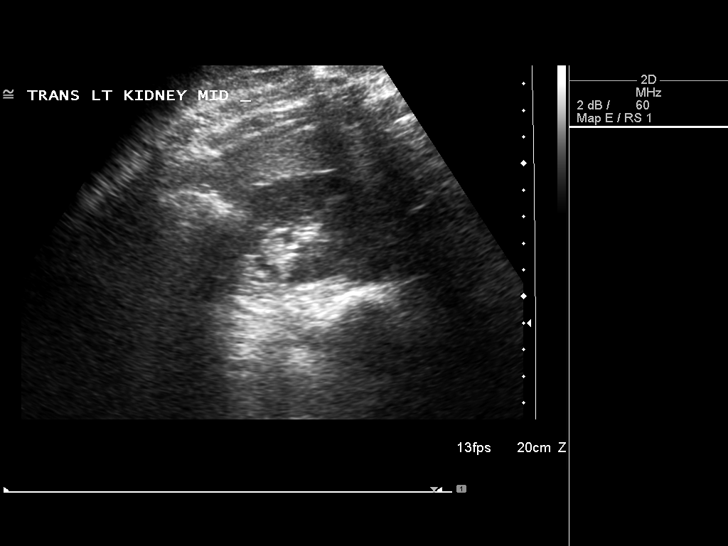

[14 of 25 positions shown; findings below may reference images not displayed]

FINDINGS: Gallbladder:

No gallstones or wall thickening visualized. No sonographic Murphy
sign noted.

Common bile duct:

Diameter: 4 mm

Liver:

No focal lesion identified. Within normal limits in parenchymal
echogenicity. Patent portal vein with normal hepatopetal flow.
Similar size measuring 18 cm in craniocaudal length.

IVC:

No abnormality visualized.

Pancreas:

Visualized portion unremarkable.

Spleen:

Normal homogeneous echogenicity. No focal abnormality. Splenic
length is 13 cm. Splenic volume is estimated at 626 cc.

Right Kidney:

Length: 10.7 cm. Echogenicity within normal limits. No mass or
hydronephrosis visualized.

Left Kidney:

Length: 11.77. Echogenicity within normal limits. No mass or
hydronephrosis visualized.

Abdominal aorta:

No aneurysm visualized.

Other findings:

None.
IMPRESSION: Stable mild hepatosplenomegaly.

No acute finding by ultrasound.

## 2014-06-06 ENCOUNTER — Ambulatory Visit (INDEPENDENT_AMBULATORY_CARE_PROVIDER_SITE_OTHER): Payer: Managed Care, Other (non HMO) | Admitting: Psychology

## 2014-06-06 DIAGNOSIS — F332 Major depressive disorder, recurrent severe without psychotic features: Secondary | ICD-10-CM

## 2014-07-18 ENCOUNTER — Ambulatory Visit (INDEPENDENT_AMBULATORY_CARE_PROVIDER_SITE_OTHER): Payer: Managed Care, Other (non HMO) | Admitting: Psychology

## 2014-07-18 DIAGNOSIS — F332 Major depressive disorder, recurrent severe without psychotic features: Secondary | ICD-10-CM

## 2014-08-22 ENCOUNTER — Ambulatory Visit (INDEPENDENT_AMBULATORY_CARE_PROVIDER_SITE_OTHER): Payer: Managed Care, Other (non HMO) | Admitting: Psychology

## 2014-08-22 DIAGNOSIS — F332 Major depressive disorder, recurrent severe without psychotic features: Secondary | ICD-10-CM | POA: Diagnosis not present

## 2014-09-19 ENCOUNTER — Ambulatory Visit (INDEPENDENT_AMBULATORY_CARE_PROVIDER_SITE_OTHER): Payer: Managed Care, Other (non HMO) | Admitting: Psychology

## 2014-09-19 DIAGNOSIS — F332 Major depressive disorder, recurrent severe without psychotic features: Secondary | ICD-10-CM | POA: Diagnosis not present

## 2014-10-17 ENCOUNTER — Ambulatory Visit (INDEPENDENT_AMBULATORY_CARE_PROVIDER_SITE_OTHER): Payer: Managed Care, Other (non HMO) | Admitting: Psychology

## 2014-10-17 DIAGNOSIS — F332 Major depressive disorder, recurrent severe without psychotic features: Secondary | ICD-10-CM

## 2014-10-21 IMAGING — US US ABDOMEN COMPLETE
1 series · 14 of 25 positions shown · non-contrast
Comparison: Abdominal ultrasound October 01, 2013

CLINICAL DATA: Re-evaluate spleen and liver ; history of
mononucleosis

EXAM:
ULTRASOUND ABDOMEN COMPLETE

[Series 1: us abdomen complete · 0.32mm/px · 14 of 78 slices shown]
[im 1/78]
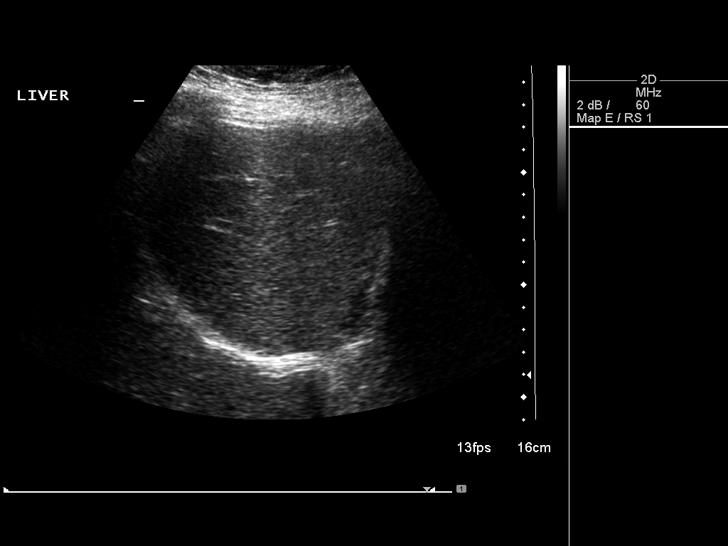
[im 7/78]
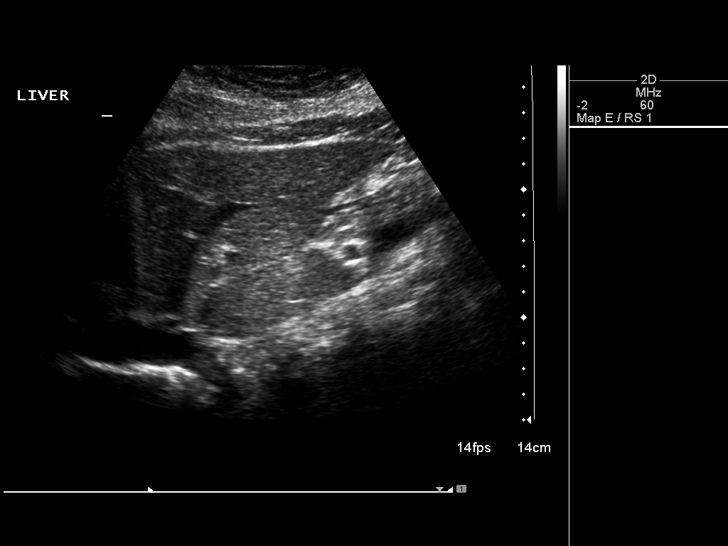
[im 13/78]
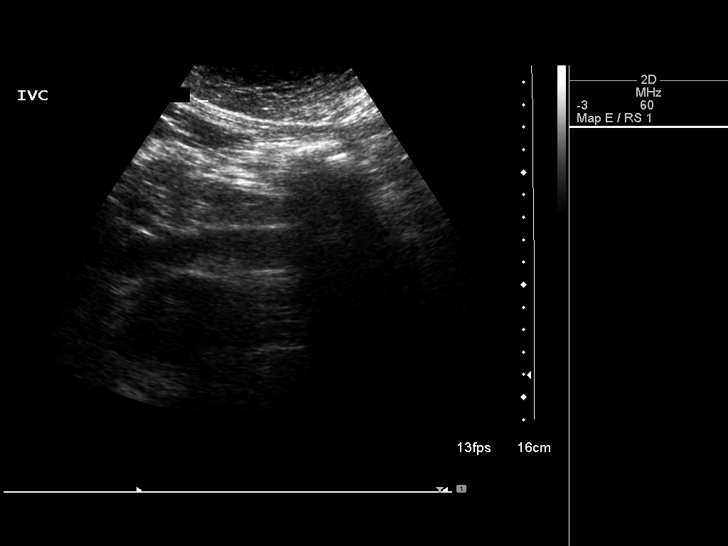
[im 20/78]
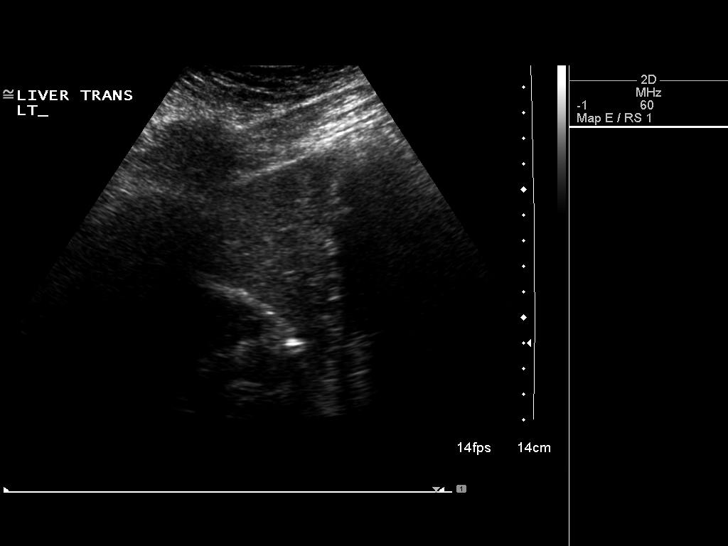
[im 26/78]
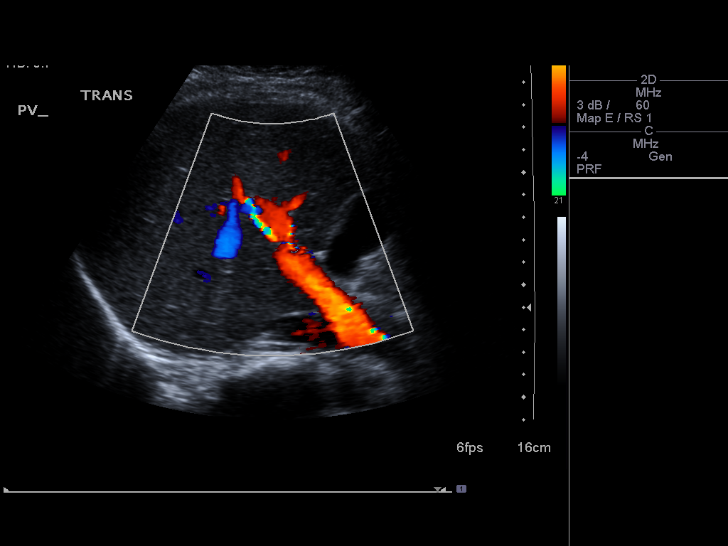
[im 29/78]
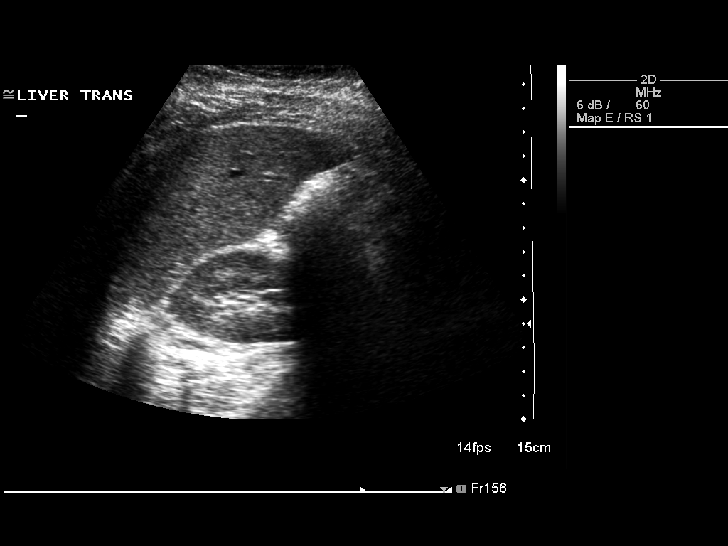
[im 36/78]
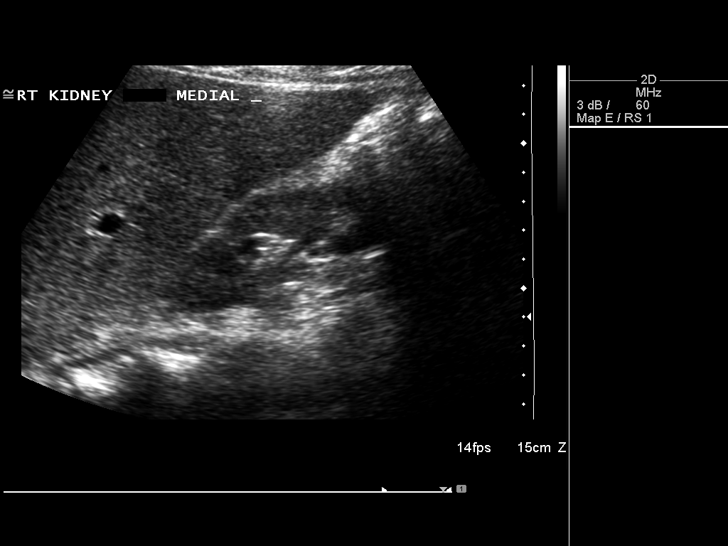
[im 42/78]
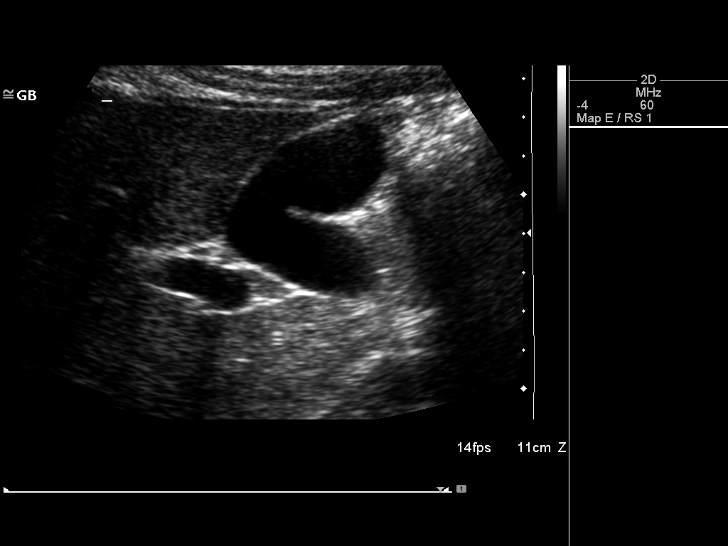
[im 49/78]
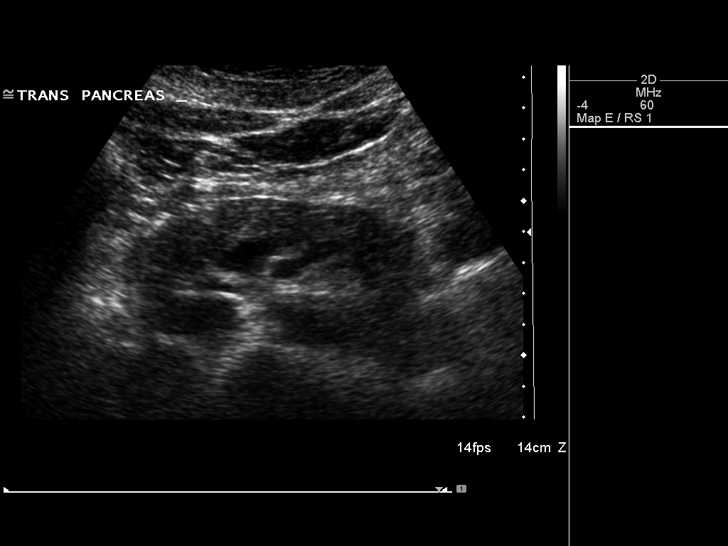
[im 52/78]
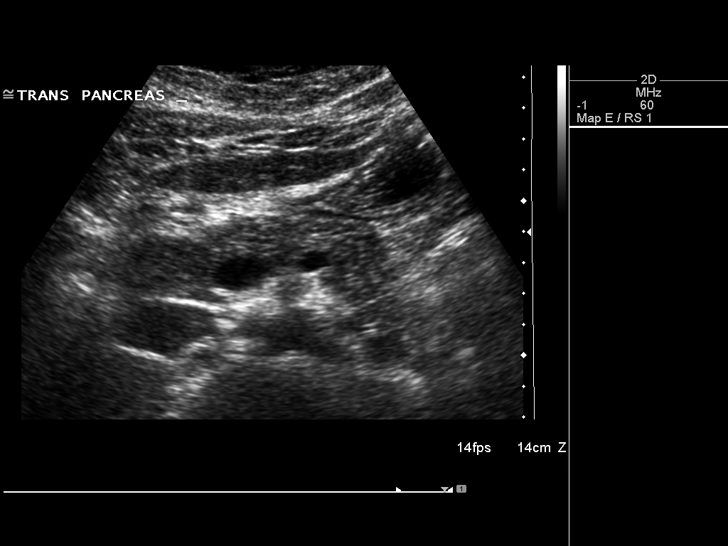
[im 58/78]
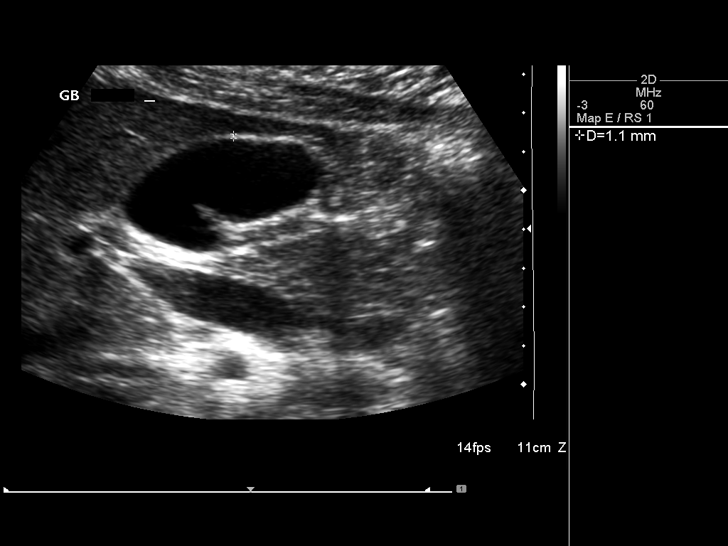
[im 65/78]
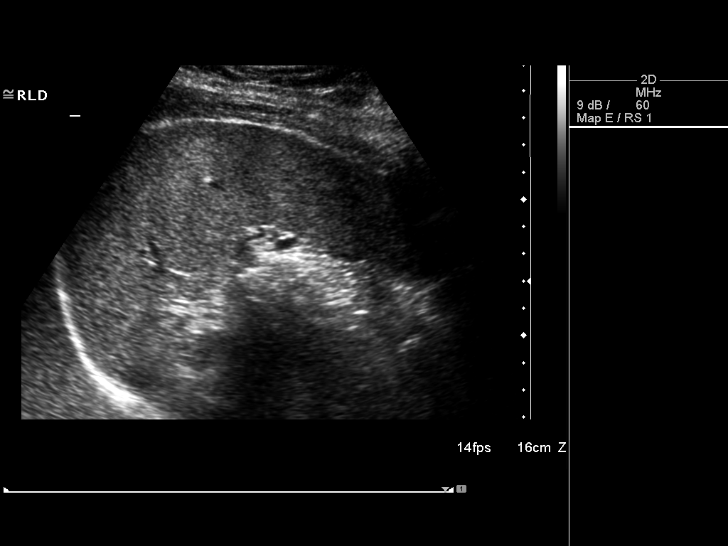
[im 71/78]
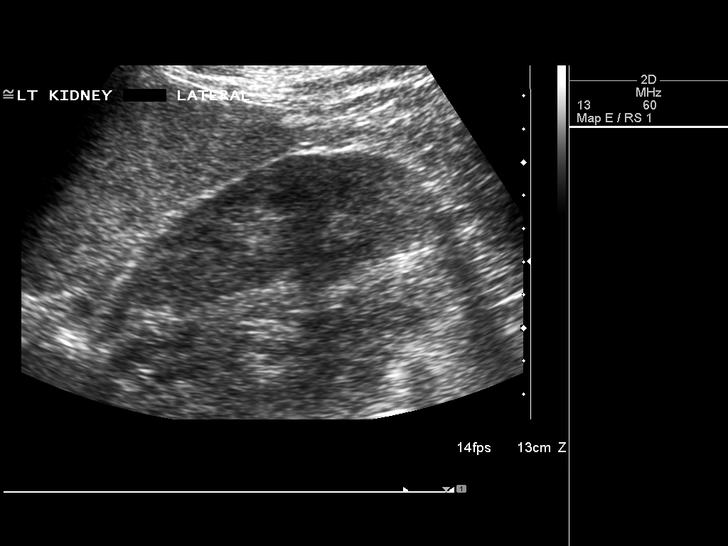
[im 78/78]
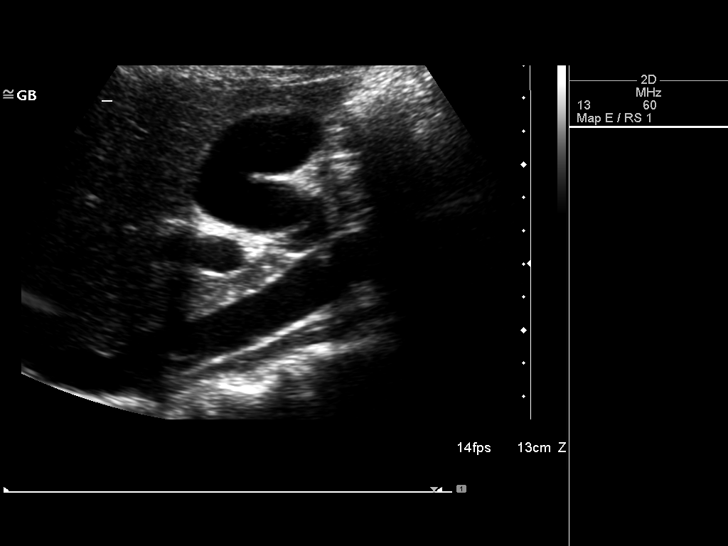

[14 of 25 positions shown; findings below may reference images not displayed]

FINDINGS: Gallbladder:

No gallstones or wall thickening visualized. No sonographic Murphy
sign noted.

Common bile duct:

Diameter: 3.7 mm

Liver:

No focal lesion identified. Within normal limits in parenchymal
echogenicity. A prominent right else lobe is demonstrated. The
maximal measured dimension of the liver is 18 cm.

IVC:

No abnormality visualized.

Pancreas:

Visualized portion unremarkable.

Spleen:

The spleen remains mildly enlarged with maximal measurement of
cm with a calculated volume of 690 cc.

Right Kidney:

Length: 12.2 cm. Echogenicity within normal limits. No mass or
hydronephrosis visualized.

Left Kidney:

Length: 12.3 cm. Echogenicity within normal limits. No mass or
hydronephrosis visualized.

Abdominal aorta:

No aneurysm visualized.

Other findings:

No ascites is demonstrated.
IMPRESSION: The spleen remains mildly enlarged and has slightly increased in
overall volume. The liver remains mildly enlarged but exhibits no
focal mass.

## 2014-11-14 ENCOUNTER — Ambulatory Visit: Payer: Managed Care, Other (non HMO) | Admitting: Psychology

## 2014-11-25 ENCOUNTER — Ambulatory Visit (INDEPENDENT_AMBULATORY_CARE_PROVIDER_SITE_OTHER): Payer: Managed Care, Other (non HMO) | Admitting: Internal Medicine

## 2014-11-25 VITALS — BP 118/64 | HR 83 | Temp 98.7°F | Resp 16 | Ht 66.75 in | Wt 208.0 lb

## 2014-11-25 DIAGNOSIS — L551 Sunburn of second degree: Secondary | ICD-10-CM | POA: Diagnosis not present

## 2014-11-25 DIAGNOSIS — R208 Other disturbances of skin sensation: Secondary | ICD-10-CM | POA: Diagnosis not present

## 2014-11-25 DIAGNOSIS — G4700 Insomnia, unspecified: Secondary | ICD-10-CM | POA: Diagnosis not present

## 2014-11-25 MED ORDER — HYDROCODONE-ACETAMINOPHEN 5-325 MG PO TABS: 1.0000 | ORAL_TABLET | Freq: Four times a day (QID) | ORAL | Status: DC | PRN

## 2014-11-25 MED ORDER — IBUPROFEN 800 MG PO TABS: 800.0000 mg | ORAL_TABLET | Freq: Three times a day (TID) | ORAL | Status: DC | PRN

## 2014-11-25 MED ORDER — TRIAMCINOLONE ACETONIDE 0.1 % EX CREA: 1.0000 "application " | TOPICAL_CREAM | Freq: Two times a day (BID) | CUTANEOUS | Status: DC
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
## 2014-11-25 NOTE — Patient Instructions (Signed)
Sunburn Sunburn is damage to the skin caused by overexposure to ultraviolet (UV) rays. People with light skin or a fair complexion may be more susceptible to sunburn. Repeated sun exposure causes early skin aging such as wrinkles and sun spots. It also increases the risk of skin cancer. CAUSES A sunburn is caused by getting too much UV radiation from the sun. SYMPTOMS  Red or pink skin.  Soreness and swelling.  Pain.  Blisters.  Peeling skin.  Headache, fever, and fatigue if sunburn covers a large area. TREATMENT  Your caregiver may tell you to take certain medicines to lessen inflammation.  Your caregiver may have you use hydrocortisone cream or spray to help with itching and inflammation.  Your caregiver may prescribe an antibiotic cream to use on blisters. HOME CARE INSTRUCTIONS   Avoid further exposure to the sun.  Cool baths and cool compresses may be helpful if used several times per day. Do not apply ice, since this may result in more damage to the skin.  Only take over-the-counter or prescription medicines for pain, discomfort, or fever as directed by your caregiver.  Use aloe or other over-the-counter sunburn creams or gels on your skin. Do not apply these creams or gels on blisters.  Drink enough fluids to keep your urine clear or pale yellow.  Do not break blisters. If blisters break, your caregiver may recommend an antibiotic cream to apply to the affected area. PREVENTION   Try to avoid the sun between 10:00 a.m. and 4:00 p.m. when it is the strongest.  Apply sunscreen at least 30 minutes before exposure to the sun.  Always wear protective hats, clothing, and sunglasses with UV protection.  Avoid medicines, herbs, and foods that increase your sensitivity to sunlight.  Avoid tanning beds. SEEK IMMEDIATE MEDICAL CARE IF:   You have a fever.  Your pain is uncontrolled with medicine.  You start to vomit or have diarrhea.  You feel faint or develop a  headache with confusion.  You develop severe blistering.  You have a pus-like (purulent) discharge coming from the blisters.  Your burn becomes more painful and swollen. MAKE SURE YOU:  Understand these instructions.  Will watch your condition.  Will get help right away if you are not doing well or get worse. Document Released: 03/17/2005 Document Revised: 10/02/2012 Document Reviewed: 11/29/2010 Exit[employer] Patient Information 2015 [employer]. This information is not intended to replace advice given to you by your health care provider. Make sure you discuss any questions you have with your health care provider.  

## 2014-12-12 ENCOUNTER — Ambulatory Visit: Payer: Managed Care, Other (non HMO) | Admitting: Psychology

## 2014-12-19 ENCOUNTER — Ambulatory Visit (INDEPENDENT_AMBULATORY_CARE_PROVIDER_SITE_OTHER): Payer: Managed Care, Other (non HMO) | Admitting: Psychology

## 2014-12-19 DIAGNOSIS — F332 Major depressive disorder, recurrent severe without psychotic features: Secondary | ICD-10-CM | POA: Diagnosis not present

## 2015-01-16 ENCOUNTER — Ambulatory Visit (INDEPENDENT_AMBULATORY_CARE_PROVIDER_SITE_OTHER): Payer: Managed Care, Other (non HMO) | Admitting: Psychology

## 2015-01-16 DIAGNOSIS — F332 Major depressive disorder, recurrent severe without psychotic features: Secondary | ICD-10-CM | POA: Diagnosis not present

## 2015-01-31 ENCOUNTER — Ambulatory Visit: Payer: Managed Care, Other (non HMO) | Admitting: Gynecology

## 2015-02-13 ENCOUNTER — Ambulatory Visit (INDEPENDENT_AMBULATORY_CARE_PROVIDER_SITE_OTHER): Payer: Managed Care, Other (non HMO) | Admitting: Psychology

## 2015-02-13 DIAGNOSIS — F332 Major depressive disorder, recurrent severe without psychotic features: Secondary | ICD-10-CM | POA: Diagnosis not present

## 2015-03-13 ENCOUNTER — Ambulatory Visit (INDEPENDENT_AMBULATORY_CARE_PROVIDER_SITE_OTHER): Payer: Managed Care, Other (non HMO) | Admitting: Psychology

## 2015-03-13 DIAGNOSIS — F332 Major depressive disorder, recurrent severe without psychotic features: Secondary | ICD-10-CM | POA: Diagnosis not present

## 2015-04-17 ENCOUNTER — Ambulatory Visit (INDEPENDENT_AMBULATORY_CARE_PROVIDER_SITE_OTHER): Payer: Managed Care, Other (non HMO) | Admitting: Family Medicine

## 2015-04-17 ENCOUNTER — Ambulatory Visit (INDEPENDENT_AMBULATORY_CARE_PROVIDER_SITE_OTHER): Payer: Managed Care, Other (non HMO) | Admitting: Psychology

## 2015-04-17 VITALS — BP 110/70 | HR 83 | Temp 98.0°F | Resp 18 | Ht 66.5 in | Wt 204.8 lb

## 2015-04-17 DIAGNOSIS — R162 Hepatomegaly with splenomegaly, not elsewhere classified: Secondary | ICD-10-CM

## 2015-04-17 DIAGNOSIS — J038 Acute tonsillitis due to other specified organisms: Secondary | ICD-10-CM | POA: Diagnosis not present

## 2015-04-17 DIAGNOSIS — F332 Major depressive disorder, recurrent severe without psychotic features: Secondary | ICD-10-CM | POA: Diagnosis not present

## 2015-04-17 LAB — COMPREHENSIVE METABOLIC PANEL
ALBUMIN: 4.5 g/dL (ref 3.6–5.1)
ALK PHOS: 56 U/L (ref 33–115)
ALT: 16 U/L (ref 6–29)
AST: 20 U/L (ref 10–30)
BILIRUBIN TOTAL: 0.4 mg/dL (ref 0.2–1.2)
BUN: 15 mg/dL (ref 7–25)
CHLORIDE: 103 mmol/L (ref 98–110)
CO2: 25 mmol/L (ref 20–31)
Calcium: 9.3 mg/dL (ref 8.6–10.2)
Creat: 0.78 mg/dL (ref 0.50–1.10)
Glucose, Bld: 81 mg/dL (ref 65–99)
Potassium: 4.1 mmol/L (ref 3.5–5.3)
SODIUM: 139 mmol/L (ref 135–146)
TOTAL PROTEIN: 7.4 g/dL (ref 6.1–8.1)

## 2015-04-17 LAB — POCT CBC
GRANULOCYTE PERCENT: 62.4 % (ref 37–80)
HCT, POC: 36.1 % — AB (ref 37.7–47.9)
Hemoglobin: 12.4 g/dL (ref 12.2–16.2)
LYMPH, POC: 2.8 (ref 0.6–3.4)
MCH, POC: 29.3 pg (ref 27–31.2)
MCHC: 34.4 g/dL (ref 31.8–35.4)
MCV: 85.1 fL (ref 80–97)
MID (CBC): 0.6 (ref 0–0.9)
MPV: 7.3 fL (ref 0–99.8)
PLATELET COUNT, POC: 207 10*3/uL (ref 142–424)
POC Granulocyte: 5.6 (ref 2–6.9)
POC LYMPH %: 30.9 % (ref 10–50)
POC MID %: 6.7 %M (ref 0–12)
RBC: 4.24 M/uL (ref 4.04–5.48)
RDW, POC: 13.2 %
WBC: 8.9 10*3/uL (ref 4.6–10.2)

## 2015-04-17 LAB — POCT RAPID STREP A (OFFICE): Rapid Strep A Screen: NEGATIVE

## 2015-04-17 MED ORDER — AMOXICILLIN 500 MG PO CAPS: 1000.0000 mg | ORAL_CAPSULE | Freq: Two times a day (BID) | ORAL | Status: DC

## 2015-04-17 NOTE — Progress Notes (Signed)
Subjective:  This chart was scribed for [name], MD by [name], ED Scribe. This patient was seen in room 13 and the patient's care was started at 11:33 AM.   Patient ID: Melanie Crosby, female    DOB: Jun 08, 1991, 24 y.o.   MRN: [id number]  HPI   Chief Complaint  Patient presents with  . Swollen Tonsils    x2 days, no sore throat. Noticed white patches yesterday.    HPI Comments: Melanie Crosby is a 24 y.o. female who presents to the [facility] complaining of swollen tonsils with white patches. Pt states throat swelling began 2 days ago that worsened yesterday with white patches on her throat. She has pain pain/difficulty with swallowing but denies sore throat.  No hx of tonsillitis. Denies  fever, chills, sweats, HA, otalgia, rhinorrhea, stuffy nose, nausea and emesis.   Was seen last year for mono. At that time she also had abdominal tenderness.  Abdominal ultrasound showed hepatosplenomegaly. Was followed by hematology and oncology. S/p MRI was negative. No further work up warranted.  Past Medical History  Diagnosis Date  . Depression   . Anxiety   . Hepatosplenomegaly 03/2013    Uncertain etiology   No Known Allergies Prior to Admission medications   Medication Sig Start Date End Date Taking? Authorizing Provider  HYDROcodone-acetaminophen (NORCO/VICODIN) 5-325 MG per tablet Take 1 tablet by mouth every 6 (six) hours as needed. Patient not taking: Reported on 04/17/2015 11/25/14   [name], MD  ibuprofen (ADVIL,MOTRIN) 800 MG tablet Take 1 tablet (800 mg total) by mouth every 8 (eight) hours as needed. Patient not taking: Reported on 04/17/2015 11/25/14   [name], MD  triamcinolone cream (KENALOG) 0.1 % Apply 1 application topically 2 (two) times daily. Do not apply to face or genitals Patient not taking: Reported on 04/17/2015 11/25/14   [name], MD    Review of Systems  Constitutional: Negative for fever, chills, diaphoresis and  fatigue.  HENT: Positive for trouble swallowing. Negative for congestion, ear discharge, ear pain, facial swelling, postnasal drip, rhinorrhea, sinus pressure, sneezing, sore throat and voice change.   Eyes: Negative for photophobia and visual disturbance.  Respiratory: Negative for cough and shortness of breath.   Gastrointestinal: Negative for nausea, vomiting and abdominal pain.  Skin: Negative for rash.  Neurological: Negative for headaches.  Hematological: Positive for adenopathy.   Objective:  Physical Exam  Constitutional: She is oriented to person, place, and time. She appears well-developed and well-nourished. No distress.  HENT:  Head: Normocephalic and atraumatic.  Right Ear: Tympanic membrane, external ear and ear canal normal.  Left Ear: Tympanic membrane, external ear and ear canal normal.  Nose: Nose normal.  Mouth/Throat: Uvula is midline and mucous membranes are normal. Oropharyngeal exudate present. No posterior oropharyngeal edema, posterior oropharyngeal erythema or tonsillar abscesses.  Eyes: Conjunctivae and EOM are normal.  Neck: Neck supple.  Cardiovascular: Normal rate, regular rhythm and normal heart sounds.  Exam reveals no gallop and no friction rub.   No murmur heard. Pulmonary/Chest: Effort normal and breath sounds normal. No respiratory distress. She has no wheezes. She has no rales. She exhibits no tenderness.  Abdominal: Soft. Bowel sounds are normal. She exhibits no distension. There is no tenderness. There is no rebound.  Musculoskeletal: Normal range of motion.  Lymphadenopathy:       Head (right side): Tonsillar adenopathy present. No submandibular adenopathy present.       Head (left side):  Tonsillar adenopathy present. No submandibular adenopathy present.    She has no cervical adenopathy.  Neurological: She is alert and oriented to person, place, and time.  Skin: Skin is warm and dry. No rash noted. She is not diaphoretic.  Psychiatric: She has a  normal mood and affect. Her behavior is normal.  Nursing note and vitals reviewed.   Filed Vitals:   04/17/15 1045  BP: 110/70  Pulse: 83  Temp: 98 F (36.7 C)  TempSrc: Oral  Resp: 18  Height: 5' 6.5" (1.689 m)  Weight: 204 lb 12.8 oz (92.897 kg)  SpO2: 99%   Results for orders placed or performed in visit on 04/17/15  POCT CBC  Result Value Ref Range   WBC 8.9 4.6 - 10.2 K/uL   Lymph, poc 2.8 0.6 - 3.4   POC LYMPH PERCENT 30.9 10 - 50 %L   MID (cbc) 0.6 0 - 0.9   POC MID % 6.7 0 - 12 %M   POC Granulocyte 5.6 2 - 6.9   Granulocyte percent 62.4 37 - 80 %G   RBC 4.24 4.04 - 5.48 M/uL   Hemoglobin 12.4 12.2 - 16.2 g/dL   HCT, POC 52.836.1 (A) 41.337.7 - 47.9 %   MCV 85.1 80 - 97 fL   MCH, POC 29.3 27 - 31.2 pg   MCHC 34.4 31.8 - 35.4 g/dL   RDW, POC 24.413.2 %   Platelet Count, POC 207 142 - 424 K/uL   MPV 7.3 0 - 99.8 fL  POCT rapid strep A  Result Value Ref Range   Rapid Strep A Screen Negative Negative    Assessment & Plan:   1. Acute tonsillitis due to other specified organisms   2. Hepatosplenomegaly     1. Acute tonsillitis: New.  Rx for Amoxicillin provided.  RTC inability to swallow. 2.  Hepatosplenomegaly: Stable; s/p heme-onc consultation in past; s/p MRI liver and several abdominal us in past two years.  Obtain CMET.   Orders Placed This Encounter  Procedures  . Culture, Group A Strep  . Comprehensive metabolic panel  . POCT CBC  . POCT rapid strep A   By signing my name below, I, [name], attest that this documentation has been prepared under the direction and in the presence of Nilda SimmerKristi Roni Scow, MD.  Electronically Signed: Andrew Auaven Small, ED Scribe. 04/17/2015. 11:48 AM.  I personally performed the services described in this documentation, which was scribed in my presence. The recorded information has been reviewed and considered.  [name] Paulita FujitaMartin Syanne Looney, M.D. [facility]  [place] [address] [place], [place]  [id number] [phone]  phone [phone] fax

## 2015-04-17 NOTE — Patient Instructions (Signed)

## 2015-04-22 ENCOUNTER — Telehealth: Payer: Self-pay

## 2015-04-22 NOTE — Telephone Encounter (Signed)
Dr. Katrinka BlazingSmith,  [facility] not [facility] what would you like me to do.

## 2015-04-23 NOTE — Telephone Encounter (Signed)
No further action warranted.

## 2015-05-21 ENCOUNTER — Ambulatory Visit (INDEPENDENT_AMBULATORY_CARE_PROVIDER_SITE_OTHER): Payer: Managed Care, Other (non HMO) | Admitting: Family Medicine

## 2015-05-21 VITALS — BP 110/62 | HR 96 | Temp 98.8°F | Resp 18 | Ht 66.5 in | Wt 201.0 lb

## 2015-05-21 DIAGNOSIS — J039 Acute tonsillitis, unspecified: Secondary | ICD-10-CM | POA: Diagnosis not present

## 2015-05-21 DIAGNOSIS — R070 Pain in throat: Secondary | ICD-10-CM

## 2015-05-21 LAB — POCT RAPID STREP A (OFFICE): Rapid Strep A Screen: NEGATIVE

## 2015-05-21 MED ORDER — CHLORHEXIDINE GLUCONATE 0.12 % MT SOLN: 15.0000 mL | Freq: Two times a day (BID) | OROMUCOSAL | Status: DC

## 2015-05-21 MED ORDER — MAGIC MOUTHWASH W/LIDOCAINE
5.0000 mL | ORAL | Status: DC | PRN
Start: 2015-05-21 — End: 2016-02-06

## 2015-05-21 NOTE — Progress Notes (Signed)
[facility] [place] [address], [place] [address] [phone]  Date:  05/21/2015   Name:  Melanie Crosby   DOB:  04-06-1991   MRN:  [id number]  PCP:  Norberto Sorenson, MD    History of Present Illness:  Melanie Crosby is a 24 y.o. female patient who presents to [facility] for chief complaint of sore throat that started late afternoon yesterday.  The pain worsened over night, and includes body aches.  She states that she has some ear pain at her right side.  There is no nasal congestion or cough.  She has no fever.  She noticed that she had white at the back of her throat this morning which brought concern.  She was treated for tonsillitis 1 month ago for similar symptoms.  Rapid strep was negative, without defining culture.  She was treated with amoxicillin, where she stated that the strep did resolve.   She has unknown sick contacts.  Works as Public affairs consultant.   Hx of mono 1.5 years ago.  Hepatomegaly is chronic and without new tenderness or enlargement according to patient.  She has no new fatigue, rash, nausea, or vomiting.  She has been followed by [facility] without ne   Patient Active Problem List   Diagnosis Date Noted  . Hepatomegaly 05/28/2013  . Hepatosplenomegaly 03/21/2013    Past Medical History  Diagnosis Date  . Depression   . Anxiety   . Hepatosplenomegaly 03/2013    Uncertain etiology    Past Surgical History  Procedure Laterality Date  . Appendectomy  2013    Social History  Substance Use Topics  . Smoking status: Never Smoker   . Smokeless tobacco: Never Used  . Alcohol Use: No    Family History  Problem Relation Age of Onset  . Multiple sclerosis Mother   . Heart disease Maternal Grandfather   . Breast cancer Maternal Aunt     Mid 50's  . Skin cancer Maternal Grandfather   . Diabetes Maternal Grandmother   . Hypertension Father   . Thyroid disease Mother     No Known Allergies  Medication list has been reviewed and  updated.  Current Outpatient Prescriptions on File Prior to Visit  Medication Sig Dispense Refill  . amoxicillin (AMOXIL) 500 MG capsule Take 2 capsules (1,000 mg total) by mouth 2 (two) times daily. (Patient not taking: Reported on 05/21/2015) 40 capsule 0  . HYDROcodone-acetaminophen (NORCO/VICODIN) 5-325 MG per tablet Take 1 tablet by mouth every 6 (six) hours as needed. (Patient not taking: Reported on 04/17/2015) 30 tablet 0  . ibuprofen (ADVIL,MOTRIN) 800 MG tablet Take 1 tablet (800 mg total) by mouth every 8 (eight) hours as needed. (Patient not taking: Reported on 04/17/2015) 30 tablet 0  . triamcinolone cream (KENALOG) 0.1 % Apply 1 application topically 2 (two) times daily. Do not apply to face or genitals (Patient not taking: Reported on 04/17/2015) 80 g 1   No current facility-administered medications on file prior to visit.    ROS ROS otherwise unremarkable unless listed above.   Physical Examination: BP 110/62 mmHg  Pulse 96  Temp(Src) 98.8 F (37.1 C) (Oral)  Resp 18  Ht 5' 6.5" (1.689 m)  Wt 201 lb (91.173 kg)  BMI 31.96 kg/m2  SpO2 98%  LMP 05/03/2015 Ideal Body Weight: Weight in (lb) to have BMI = 25: 156.9  Physical Exam  Constitutional: She is oriented to person, place, and time. She appears well-developed and well-nourished. No distress.  HENT:  Head: Normocephalic and atraumatic.  Right Ear: Tympanic membrane, external ear and ear canal normal.  Left Ear: Tympanic membrane, external ear and ear canal normal.  Nose: No mucosal edema or rhinorrhea. Right sinus exhibits no maxillary sinus tenderness and no frontal sinus tenderness. Left sinus exhibits no maxillary sinus tenderness and no frontal sinus tenderness.  Mouth/Throat: No uvula swelling. Oropharyngeal exudate (darkening tissue with exudate.  ), posterior oropharyngeal edema and posterior oropharyngeal erythema present.  Eyes: Conjunctivae and EOM are normal. Pupils are equal, round, and reactive to  light.  Cardiovascular: Normal rate and regular rhythm.  Exam reveals no gallop, no distant heart sounds and no friction rub.   No murmur heard. Pulmonary/Chest: Effort normal. No respiratory distress. She has no decreased breath sounds. She has no wheezes. She has no rhonchi.  Lymphadenopathy:       Head (right side): No submandibular, no tonsillar, no preauricular and no posterior auricular adenopathy present.       Head (left side): No submandibular, no tonsillar, no preauricular and no posterior auricular adenopathy present.  Neurological: She is alert and oriented to person, place, and time.  Skin: She is not diaphoretic.  Psychiatric: She has a normal mood and affect. Her behavior is normal.    Results for orders placed or performed in visit on 05/21/15  POCT rapid strep A  Result Value Ref Range   Rapid Strep A Screen Negative Negative   Assessment and Plan: Melanie Crosby is a 24 y.o. female who is here today for throat pain.  She recently was treated with abx.  Hepatomegaly is chronic and she is not experiencing new onset of sxs besides throat pain and body aches.  We will start her on chlorhexidine gluconate gargling, as well as magic mouthwash.  Advised ibuprofen/tylenol as well for pain.  She will return if sxs do not improve in 48 hours.   -culture performed today.  Acute tonsillitis, unspecified etiology - Plan: magic mouthwash w/lidocaine SOLN, Culture, Group A Strep  Throat pain in adult - Plan: POCT rapid strep A, chlorhexidine (PERIDEX) 0.12 % solution, magic mouthwash w/lidocaine SOLN, Culture, Group A Strep  Trena PlattStephanie Gennesis Hogland, PA-C [facility] 05/21/2015 11:27 AM  Addendum: Patient contacted office with concern of using the chlorhexidine, as she has porcelain dental work.  We will use listerine instead, following gargling of the magic mouthwash.  Advised sheketia, TL to contact patient on this.

## 2015-05-21 NOTE — Patient Instructions (Signed)
Tonsillitis °Tonsillitis is an infection of the throat. This infection causes the tonsils to become red, tender, and puffy (swollen). Tonsils are groups of tissue at the back of your throat. If bacteria caused your infection, antibiotic medicine will be given to you. Sometimes symptoms of tonsillitis can be relieved with the use of steroid medicine. If your tonsillitis is severe and happens often, you may need to get your tonsils removed (tonsillectomy). °HOME CARE  °· Rest and sleep often. °· Drink enough fluids to keep your pee (urine) clear or pale yellow. °· While your throat is sore, eat soft or liquid foods like: °¨ Soup. °¨ Ice cream. °¨ Instant breakfast drinks. °· Eat frozen ice pops. °· Gargle with a warm or cold liquid to help soothe the throat. Gargle with a water and salt mix. Mix 1/4 teaspoon of salt and 1/4 teaspoon of baking soda in 1 cup of water. °· Only take medicines as told by your doctor. °· If you are given medicines (antibiotics), take them as told. Finish them even if you start to feel better. °GET HELP IF: °· You have large, tender lumps in your neck. °· You have a rash. °· You cough up green, yellow-brown, or bloody fluid. °· You cannot swallow liquids or food for 24 hours. °· You notice that only one of your tonsils is swollen. °GET HELP RIGHT AWAY IF:  °· You throw up (vomit). °· You have a very bad headache. °· You have a stiff neck. °· You have chest pain. °· You have trouble breathing or swallowing. °· You have bad throat pain, drooling, or your voice changes. °· You have bad pain not helped by medicine. °· You cannot fully open your mouth. °· You have redness, puffiness, or bad pain in the neck. °· You have a fever. °MAKE SURE YOU:  °· Understand these instructions. °· Will watch your condition. °· Will get help right away if you are not doing well or get worse. °  °This information is not intended to replace advice given to you by your health care provider. Make sure you discuss any  questions you have with your health care provider. °  °Document Released: 11/24/2007 Document Revised: 06/12/2013 Document Reviewed: 11/24/2012 °Elsevier Interactive Patient Education ©2016 Elsevier Inc. ° °

## 2015-05-23 ENCOUNTER — Other Ambulatory Visit: Payer: Self-pay | Admitting: Physician Assistant

## 2015-05-23 DIAGNOSIS — J039 Acute tonsillitis, unspecified: Secondary | ICD-10-CM

## 2015-05-23 LAB — CULTURE, GROUP A STREP: ORGANISM ID, BACTERIA: NORMAL

## 2015-05-23 MED ORDER — AMOXICILLIN 875 MG PO TABS: 875.0000 mg | ORAL_TABLET | Freq: Two times a day (BID) | ORAL | Status: AC

## 2015-06-12 ENCOUNTER — Ambulatory Visit (INDEPENDENT_AMBULATORY_CARE_PROVIDER_SITE_OTHER): Payer: Managed Care, Other (non HMO) | Admitting: Psychology

## 2015-06-12 DIAGNOSIS — F332 Major depressive disorder, recurrent severe without psychotic features: Secondary | ICD-10-CM | POA: Diagnosis not present

## 2015-07-17 ENCOUNTER — Ambulatory Visit (INDEPENDENT_AMBULATORY_CARE_PROVIDER_SITE_OTHER): Payer: Managed Care, Other (non HMO) | Admitting: Psychology

## 2015-07-17 DIAGNOSIS — F332 Major depressive disorder, recurrent severe without psychotic features: Secondary | ICD-10-CM

## 2015-08-14 ENCOUNTER — Ambulatory Visit (INDEPENDENT_AMBULATORY_CARE_PROVIDER_SITE_OTHER): Payer: Managed Care, Other (non HMO) | Admitting: Psychology

## 2015-08-14 DIAGNOSIS — F332 Major depressive disorder, recurrent severe without psychotic features: Secondary | ICD-10-CM

## 2015-09-11 ENCOUNTER — Ambulatory Visit (INDEPENDENT_AMBULATORY_CARE_PROVIDER_SITE_OTHER): Payer: Managed Care, Other (non HMO) | Admitting: Psychology

## 2015-09-11 DIAGNOSIS — F332 Major depressive disorder, recurrent severe without psychotic features: Secondary | ICD-10-CM | POA: Diagnosis not present

## 2015-10-16 ENCOUNTER — Ambulatory Visit: Payer: Managed Care, Other (non HMO) | Admitting: Psychology

## 2015-11-13 ENCOUNTER — Ambulatory Visit: Payer: Managed Care, Other (non HMO) | Admitting: Psychology

## 2015-12-11 ENCOUNTER — Ambulatory Visit (INDEPENDENT_AMBULATORY_CARE_PROVIDER_SITE_OTHER): Payer: Managed Care, Other (non HMO) | Admitting: Psychology

## 2015-12-11 DIAGNOSIS — F332 Major depressive disorder, recurrent severe without psychotic features: Secondary | ICD-10-CM

## 2016-01-15 ENCOUNTER — Ambulatory Visit (INDEPENDENT_AMBULATORY_CARE_PROVIDER_SITE_OTHER): Payer: Managed Care, Other (non HMO) | Admitting: Psychology

## 2016-01-15 DIAGNOSIS — F4323 Adjustment disorder with mixed anxiety and depressed mood: Secondary | ICD-10-CM

## 2016-02-06 ENCOUNTER — Ambulatory Visit (INDEPENDENT_AMBULATORY_CARE_PROVIDER_SITE_OTHER): Payer: Managed Care, Other (non HMO) | Admitting: Nurse Practitioner

## 2016-02-06 ENCOUNTER — Telehealth: Payer: Self-pay | Admitting: Obstetrics and Gynecology

## 2016-02-06 ENCOUNTER — Encounter: Payer: Self-pay | Admitting: Nurse Practitioner

## 2016-02-06 VITALS — BP 110/72 | HR 72 | Ht 66.0 in | Wt 193.0 lb

## 2016-02-06 DIAGNOSIS — Z113 Encounter for screening for infections with a predominantly sexual mode of transmission: Secondary | ICD-10-CM

## 2016-02-06 DIAGNOSIS — N926 Irregular menstruation, unspecified: Secondary | ICD-10-CM

## 2016-02-06 LAB — CBC WITH DIFFERENTIAL/PLATELET
BASOS PCT: 0 %
Basophils Absolute: 0 cells/uL (ref 0–200)
Eosinophils Absolute: 71 cells/uL (ref 15–500)
Eosinophils Relative: 1 %
HEMATOCRIT: 37.2 % (ref 35.0–45.0)
Hemoglobin: 11.8 g/dL (ref 11.7–15.5)
LYMPHS PCT: 27 %
Lymphs Abs: 1917 cells/uL (ref 850–3900)
MCH: 28.6 pg (ref 27.0–33.0)
MCHC: 31.7 g/dL — AB (ref 32.0–36.0)
MCV: 90.3 fL (ref 80.0–100.0)
MONOS PCT: 6 %
MPV: 10.8 fL (ref 7.5–12.5)
Monocytes Absolute: 426 cells/uL (ref 200–950)
NEUTROS PCT: 66 %
Neutro Abs: 4686 cells/uL (ref 1500–7800)
PLATELETS: 214 10*3/uL (ref 140–400)
RBC: 4.12 MIL/uL (ref 3.80–5.10)
RDW: 13.7 % (ref 11.0–15.0)
WBC: 7.1 10*3/uL (ref 3.8–10.8)

## 2016-02-06 LAB — POCT URINE PREGNANCY: PREG TEST UR: NEGATIVE

## 2016-02-06 MED ORDER — MEDROXYPROGESTERONE ACETATE 10 MG PO TABS: 10.0000 mg | ORAL_TABLET | Freq: Every day | ORAL | 0 refills | Status: DC

## 2016-02-06 NOTE — Progress Notes (Signed)
25 y.o. Single Caucasian female G0P0000 here for evaluation of AUB.  She had a normal menses on  up LMP 8/82017.  Her normal menses is at 5-6 days.  This cycle is now day 11.  Usually has 2 days of heavy then lighter.  This cycle went heavy for a longer days, then moderate until heavy again last pm.  Some increase in cramps at the start.  Condoms sometimes. Last SA early July.  Same partner for 2.5 yrs but the relationship is sporadic and on / off.  ? Std exposure.   She is taking no new med's herbal or RX.  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 She has some fatigue but denies dyspnea.  No recent STD's.  Last AEX 2015 with Dr. Farrel GobbleLathrop.   O: Healthy WD,WN female Affect:  Skin: Abdomen:soft, non tender, normal bowel sounds Pelvic exam:EXTERNAL GENITALIA: normal appearing vulva with no masses, tenderness or lesions VAGINA: no abnormal discharge or lesions and flow is light  CERVIX: no lesions or cervical motion tenderness UTERUS: gravid and anteverted ADNEXA: no masses palpable and non tender  UPT:  Negative HGB: 11.6  A: AUB  R/O STD's  Slight anemia   P:  Discussed findings of low HGB from bleeding.  Will follow with STD results, CBC  Will give her Provera 10 mg for 7 days and expect a withdrawal bleeding   Labs   Instructions given regarding: If bleeding is prolonged or very heavy to call back.  Use condoms each time.  Return for AEX.  RV

## 2016-02-06 NOTE — Progress Notes (Signed)
Reviewed personally.  M. Suzanne Lynx Goodrich, MD.  

## 2016-02-06 NOTE — Patient Instructions (Signed)

## 2016-02-06 NOTE — Telephone Encounter (Signed)
Spoke with patient. Patient reports today is day 11 of her menses. Reports when her cycle started it was heavy and she was having to change her pad/tampon every 2-3 hours. States she was having heavy clotting. Now cycle has decreased, but she is still passing clots that are around the size of a half dollar. Reports increased fatigue. Denies SOB or dizziness. Is not currently on any form of birth control. Advised she will need to be seen in the office for further evaluation. She is agreeable. Appointment scheduled for today at 10:15 am with Ria CommentPatricia Grubb, FNP. She is agreeable to date and time.  Routing to provider for final review. Patient agreeable to disposition. Will close encounter.

## 2016-02-06 NOTE — Telephone Encounter (Signed)
Patient says her cycle has been on for 11days. Last appt 01/25/14.

## 2016-02-07 LAB — STD PANEL
HEP B S AG: NEGATIVE
HIV: NONREACTIVE

## 2016-02-09 LAB — HEMOGLOBIN, FINGERSTICK: Hemoglobin, fingerstick: 11.6 g/dL — ABNORMAL LOW (ref 12.0–16.0)

## 2016-02-10 LAB — IPS N GONORRHOEA AND CHLAMYDIA BY PCR

## 2016-02-11 ENCOUNTER — Telehealth: Payer: Self-pay

## 2016-02-11 MED ORDER — AZITHROMYCIN 1 G PO PACK: 1.0000 | PACK | Freq: Once | ORAL | 0 refills | Status: AC

## 2016-02-11 NOTE — Telephone Encounter (Signed)
Left message to call Issabella Rix at [phone]. 

## 2016-02-11 NOTE — Telephone Encounter (Signed)
Patient returned call

## 2016-02-11 NOTE — Telephone Encounter (Signed)
Spoke with patient. Advised of results as seen below from Ria CommentPatricia Grubb, FNP. She is agreeable and verbalizes understanding. Rx for Azithromycin 1 gram powder sent to pharmacy on file. Aware partner will need to be notified and treated. Advised no sexual intercourse until after she and her partner have received treatment. Will need to use condoms with sexual intercourse. 3 month recheck scheduled for 05/10/2016 at 8:30 am with Ria CommentPatricia Grubb, FNP. She is agreeable to date and time. Health department form with results faxed to [phone] with cover sheet and confirmation. Form to Ria CommentPatricia Grubb, FNP for signature to scan.  Routing to provider for final review. Patient agreeable to disposition. Will close encounter.

## 2016-02-11 NOTE — Telephone Encounter (Signed)
-----   Message from Ria CommentPatricia Grubb, FNP sent at 02/10/2016  5:13 PM EDT ----- Please let pt know that her Chlamydia test is positive.  She will need Zithromax and TOC, HD card mailed.  She is to notify partner(s)

## 2016-02-12 ENCOUNTER — Ambulatory Visit (INDEPENDENT_AMBULATORY_CARE_PROVIDER_SITE_OTHER): Payer: Managed Care, Other (non HMO) | Admitting: Psychology

## 2016-02-12 DIAGNOSIS — F4323 Adjustment disorder with mixed anxiety and depressed mood: Secondary | ICD-10-CM

## 2016-03-18 ENCOUNTER — Ambulatory Visit (INDEPENDENT_AMBULATORY_CARE_PROVIDER_SITE_OTHER): Payer: Managed Care, Other (non HMO) | Admitting: Psychology

## 2016-03-18 DIAGNOSIS — F4323 Adjustment disorder with mixed anxiety and depressed mood: Secondary | ICD-10-CM | POA: Diagnosis not present

## 2016-03-30 ENCOUNTER — Ambulatory Visit (INDEPENDENT_AMBULATORY_CARE_PROVIDER_SITE_OTHER): Payer: Managed Care, Other (non HMO) | Admitting: Psychology

## 2016-03-30 DIAGNOSIS — F4323 Adjustment disorder with mixed anxiety and depressed mood: Secondary | ICD-10-CM | POA: Diagnosis not present

## 2016-04-15 ENCOUNTER — Ambulatory Visit (INDEPENDENT_AMBULATORY_CARE_PROVIDER_SITE_OTHER): Payer: Managed Care, Other (non HMO) | Admitting: Psychology

## 2016-04-15 DIAGNOSIS — F4323 Adjustment disorder with mixed anxiety and depressed mood: Secondary | ICD-10-CM | POA: Diagnosis not present

## 2016-05-07 ENCOUNTER — Ambulatory Visit (INDEPENDENT_AMBULATORY_CARE_PROVIDER_SITE_OTHER): Payer: Managed Care, Other (non HMO) | Admitting: Family Medicine

## 2016-05-07 VITALS — BP 122/72 | HR 73 | Temp 98.6°F | Resp 17 | Ht 66.5 in | Wt 192.0 lb

## 2016-05-07 DIAGNOSIS — Z0000 Encounter for general adult medical examination without abnormal findings: Secondary | ICD-10-CM

## 2016-05-07 DIAGNOSIS — Z23 Encounter for immunization: Secondary | ICD-10-CM | POA: Diagnosis not present

## 2016-05-07 DIAGNOSIS — Z6830 Body mass index (BMI) 30.0-30.9, adult: Secondary | ICD-10-CM | POA: Diagnosis not present

## 2016-05-07 DIAGNOSIS — E6609 Other obesity due to excess calories: Secondary | ICD-10-CM

## 2016-05-07 DIAGNOSIS — Z13220 Encounter for screening for lipoid disorders: Secondary | ICD-10-CM | POA: Diagnosis not present

## 2016-05-07 DIAGNOSIS — F32A Depression, unspecified: Secondary | ICD-10-CM

## 2016-05-07 DIAGNOSIS — Z131 Encounter for screening for diabetes mellitus: Secondary | ICD-10-CM | POA: Diagnosis not present

## 2016-05-07 DIAGNOSIS — R161 Splenomegaly, not elsewhere classified: Secondary | ICD-10-CM

## 2016-05-07 DIAGNOSIS — F329 Major depressive disorder, single episode, unspecified: Secondary | ICD-10-CM

## 2016-05-07 DIAGNOSIS — R160 Hepatomegaly, not elsewhere classified: Secondary | ICD-10-CM | POA: Diagnosis not present

## 2016-05-07 LAB — CBC WITH DIFFERENTIAL/PLATELET
BASOS ABS: 0 {cells}/uL (ref 0–200)
Basophils Relative: 0 %
EOS ABS: 93 {cells}/uL (ref 15–500)
EOS PCT: 1 %
HCT: 36.8 % (ref 35.0–45.0)
HEMOGLOBIN: 12.3 g/dL (ref 11.7–15.5)
LYMPHS ABS: 2325 {cells}/uL (ref 850–3900)
Lymphocytes Relative: 25 %
MCH: 29.2 pg (ref 27.0–33.0)
MCHC: 33.4 g/dL (ref 32.0–36.0)
MCV: 87.4 fL (ref 80.0–100.0)
MPV: 10 fL (ref 7.5–12.5)
Monocytes Absolute: 558 cells/uL (ref 200–950)
Monocytes Relative: 6 %
NEUTROS ABS: 6324 {cells}/uL (ref 1500–7800)
Neutrophils Relative %: 68 %
Platelets: 226 10*3/uL (ref 140–400)
RBC: 4.21 MIL/uL (ref 3.80–5.10)
RDW: 14 % (ref 11.0–15.0)
WBC: 9.3 10*3/uL (ref 3.8–10.8)

## 2016-05-07 LAB — LIPID PANEL
CHOLESTEROL: 159 mg/dL (ref <200)
HDL: 65 mg/dL (ref >50)
LDL Cholesterol: 83 mg/dL (ref <100)
TRIGLYCERIDES: 56 mg/dL (ref <150)
Total CHOL/HDL Ratio: 2.4 Ratio (ref <5.0)
VLDL: 11 mg/dL (ref <30)

## 2016-05-07 LAB — COMPREHENSIVE METABOLIC PANEL
ALBUMIN: 4.6 g/dL (ref 3.6–5.1)
ALK PHOS: 56 U/L (ref 33–115)
ALT: 15 U/L (ref 6–29)
AST: 22 U/L (ref 10–30)
BILIRUBIN TOTAL: 0.3 mg/dL (ref 0.2–1.2)
BUN: 18 mg/dL (ref 7–25)
CHLORIDE: 104 mmol/L (ref 98–110)
CO2: 22 mmol/L (ref 20–31)
CREATININE: 0.82 mg/dL (ref 0.50–1.10)
Calcium: 10 mg/dL (ref 8.6–10.2)
Glucose, Bld: 82 mg/dL (ref 65–99)
Potassium: 4.3 mmol/L (ref 3.5–5.3)
SODIUM: 139 mmol/L (ref 135–146)
TOTAL PROTEIN: 7.3 g/dL (ref 6.1–8.1)

## 2016-05-07 LAB — TSH: TSH: 1.56 m[IU]/L

## 2016-05-07 LAB — HEMOGLOBIN A1C
Hgb A1c MFr Bld: 4.8 % (ref <5.7)
Mean Plasma Glucose: 91 mg/dL

## 2016-05-07 NOTE — Patient Instructions (Addendum)
   IF you received an x-ray today, you will receive an invoice from [facility]. Please contact [facility] at [phone] with questions or concerns regarding your invoice.   IF you received labwork today, you will receive an invoice from [facility]. Please contact [facility] at [phone] with questions or concerns regarding your invoice.   Our billing staff will not be able to assist you with questions regarding bills from these companies.  You will be contacted with the lab results as soon as they are available. The fastest way to get your results is to activate your My Chart account. Instructions are located on the last page of this paperwork. If you have not heard from us regarding the results in 2 weeks, please contact this office.     Keeping You Healthy  Get These Tests 1. Blood Pressure- Have your blood pressure checked once a year by your health care provider.  Normal blood pressure is 120/80. 2. Weight- Have your body mass index (BMI) calculated to screen for obesity.  BMI is measure of body fat based on height and weight.  You can also calculate your own BMI at www.nhlbisupport.com/bmi/. 3. Cholesterol- Have your cholesterol checked every 5 years starting at age 20 then yearly starting at age 45. 4. Chlamydia, HIV, and other sexually transmitted diseases- Get screened every year until age 25, then within three months of each new sexual provider. 5. Pap Test - Every 1-5 years; discuss with your health care provider. 6. Mammogram- Every 1-2 years starting at age 40--50  Take these medicines  Calcium with Vitamin D-Your body needs 1200 mg of Calcium each day and 800-1000 IU of Vitamin D daily.  Your body can only absorb 500 mg of Calcium at a time so Calcium must be taken in 2 or 3 divided doses throughout the day.  Multivitamin with folic acid- Once daily if it is possible for you to become pregnant.  Get these  Immunizations  Gardasil-Series of three doses; prevents HPV related illness such as genital warts and cervical cancer.  Menactra-Single dose; prevents meningitis.  Tetanus shot- Every 10 years.  Flu shot-Every year.  Take these steps 1. Do not smoke-Your healthcare provider can help you quit.  For tips on how to quit go to www.smokefree.gov or call 1-800 QUITNOW. 2. Be physically active- Exercise 5 days a week for at least 30 minutes.  If you are not already physically active, start slow and gradually work up to 30 minutes of moderate physical activity.  Examples of moderate activity include walking briskly, dancing, swimming, bicycling, etc. 3. Breast Cancer- A self breast exam every month is important for early detection of breast cancer.  For more information and instruction on self breast exams, ask your healthcare provider or www.womenshealth.gov/faq/breast-self-exam.cfm. 4. Eat a healthy diet- Eat a variety of healthy foods such as fruits, vegetables, whole grains, low fat milk, low fat cheeses, yogurt, lean meats, poultry and fish, beans, nuts, tofu, etc.  For more information go to www. Thenutritionsource.org 5. Drink alcohol in moderation- Limit alcohol intake to one drink or less per day. Never drink and drive. 6. Depression- Your emotional health is as important as your physical health.  If you're feeling down or losing interest in things you normally enjoy please talk to your healthcare provider about being screened for depression. 7. Dental visit- Brush and floss your teeth twice daily; visit your dentist twice a year. 8. Eye doctor- Get an eye exam at least every   2 years. 9. Helmet use- Always wear a helmet when riding a bicycle, motorcycle, rollerblading or skateboarding. 10. Safe sex- If you may be exposed to sexually transmitted infections, use a condom. 11. Seat belts- Seat belts can save your live; always wear one. 12. Smoke/Carbon Monoxide detectors- These detectors need to  be installed on the appropriate level of your home. Replace batteries at least once a year. 13. Skin cancer- When out in the sun please cover up and use sunscreen 15 SPF or higher. 14. Violence- If anyone is threatening or hurting you, please tell your healthcare provider.        

## 2016-05-07 NOTE — Progress Notes (Signed)
Subjective:    Patient ID: Melanie Crosby, female    DOB: 07/16/1990, 25 y.o.   MRN: [id number]  05/07/2016  Annual Exam   HPI This 25 y.o. female presents for evaluation for Complete Physical Examination.  Last physical:   02/04/2014 Pap smear:  01-25-2014 Gardisil: never Needs second Hepatitis A. Refuses flu vaccine. Eye exam: yearly; +glasses Dental exam: every six months.  Immunization History  Administered Date(s) Administered  . DTaP 12/23/1989, 02/17/1990, 03/24/1990, 06/08/1991, 11/02/1994  . HPV 9-valent 05/07/2016  . Hepatitis A 09/19/2009  . Hepatitis A, Adult 05/07/2016  . Hepatitis B 10/13/1992, 11/11/1992, 11/12/1993  . HiB (PRP-OMP) 12/23/1989, 02/17/1990, 03/24/1990, 02/23/1991  . IPV 12/23/1989, 02/17/1990, 06/08/1991, 11/02/1994  . MMR 02/23/1991, 01/12/1996  . Meningococcal Conjugate 12/19/2007  . Tdap 03/29/2001, 05/07/2016  . Varicella 02/03/2000, 06/21/2009   BP Readings from Last 3 Encounters:  05/10/16 106/64  05/07/16 122/72  02/06/16 110/72   Wt Readings from Last 3 Encounters:  05/10/16 193 lb (87.5 kg)  05/07/16 192 lb (87.1 kg)  02/06/16 193 lb (87.5 kg)     Review of Systems  Constitutional: Negative for activity change, appetite change, chills, diaphoresis, fatigue, fever and unexpected weight change.  HENT: Negative for congestion, dental problem, drooling, ear discharge, ear pain, facial swelling, hearing loss, mouth sores, nosebleeds, postnasal drip, rhinorrhea, sinus pressure, sneezing, sore throat, tinnitus, trouble swallowing and voice change.   Eyes: Negative for photophobia, pain, discharge, redness, itching and visual disturbance.  Respiratory: Negative for apnea, cough, choking, chest tightness, shortness of breath, wheezing and stridor.   Cardiovascular: Negative for chest pain, palpitations and leg swelling.  Gastrointestinal: Negative for abdominal distention, abdominal pain, anal bleeding, blood in stool, constipation,  diarrhea, nausea, rectal pain and vomiting.  Endocrine: Negative for cold intolerance, heat intolerance, polydipsia, polyphagia and polyuria.  Genitourinary: Negative for decreased urine volume, difficulty urinating, dyspareunia, dysuria, enuresis, flank pain, frequency, genital sores, hematuria, menstrual problem, pelvic pain, urgency, vaginal bleeding, vaginal discharge and vaginal pain.  Musculoskeletal: Negative for arthralgias, back pain, gait problem, joint swelling, myalgias, neck pain and neck stiffness.  Skin: Negative for color change, pallor, rash and wound.  Allergic/Immunologic: Negative for environmental allergies, food allergies and immunocompromised state.  Neurological: Negative for dizziness, tremors, seizures, syncope, facial asymmetry, speech difficulty, weakness, light-headedness, numbness and headaches.  Hematological: Negative for adenopathy. Does not bruise/bleed easily.  Psychiatric/Behavioral: Positive for dysphoric mood. Negative for agitation, behavioral problems, confusion, decreased concentration, hallucinations, self-injury, sleep disturbance and suicidal ideas. The patient is nervous/anxious. The patient is not hyperactive.     Past Medical History:  Diagnosis Date  . Anxiety   . Depression   . Hepatosplenomegaly 61/9509   Uncertain etiology   Past Surgical History:  Procedure Laterality Date  . APPENDECTOMY  2013   No Known Allergies No current outpatient prescriptions on file.   No current facility-administered medications for this visit.    Social History   Social History  . Marital status: Single    Spouse name: N/A  . Number of children: 0  . Years of education: N/A   Occupational History  . supervisor    Social History Main Topics  . Smoking status: Never Smoker  . Smokeless tobacco: Never Used  . Alcohol use No  . Drug use: No  . Sexual activity: Yes    Partners: Male    Birth control/ protection: Condom, Coitus interruptus   Other  Topics Concern  . Not on file   Social History Narrative  Marital status: single; not dating in 2017; males      Children: none      Lives: alone with cat      Employment: Librarian, academic in Civil engineer, contracting since 2014; Statistician program.      Tobacco: none      Alcohol: wine rarely      Drugs:  None      Exercise: daily; zumba      Seatbelt: 100%; no texting while driving      Sexual activity: none; total partners = 1.  +STD = chlamydia.    Family History  Problem Relation Age of Onset  . Multiple sclerosis Mother   . Thyroid disease Mother   . Hypertension Father   . Mental illness Father   . Mental illness Brother   . Diabetes Maternal Grandmother   . Heart disease Maternal Grandfather   . Skin cancer Maternal Grandfather   . Breast cancer Maternal Aunt     Mid 50's       Objective:    BP 122/72 (BP Location: Right Arm, Patient Position: Sitting, Cuff Size: Normal)   Pulse 73   Temp 98.6 F (37 C) (Oral)   Resp 17   Ht 5' 6.5" (1.689 m)   Wt 192 lb (87.1 kg)   SpO2 98%   BMI 30.53 kg/m  Physical Exam  Constitutional: She is oriented to person, place, and time. She appears well-developed and well-nourished. No distress.  HENT:  Head: Normocephalic and atraumatic.  Right Ear: External ear normal.  Left Ear: External ear normal.  Nose: Nose normal.  Mouth/Throat: Oropharynx is clear and moist.  Eyes: Conjunctivae and EOM are normal. Pupils are equal, round, and reactive to light.  Neck: Normal range of motion and full passive range of motion without pain. Neck supple. No JVD present. Carotid bruit is not present. No thyromegaly present.  Cardiovascular: Normal rate, regular rhythm and normal heart sounds.  Exam reveals no gallop and no friction rub.   No murmur heard. Pulmonary/Chest: Effort normal and breath sounds normal. She has no wheezes. She has no rales. Right breast exhibits no inverted nipple, no mass, no nipple discharge, no skin change and no  tenderness. Left breast exhibits no inverted nipple, no mass, no nipple discharge, no skin change and no tenderness. Breasts are symmetrical.  Abdominal: Soft. Bowel sounds are normal. She exhibits no distension and no mass. There is no tenderness. There is no rebound and no guarding.  Musculoskeletal:       Right shoulder: Normal.       Left shoulder: Normal.       Cervical back: Normal.  Lymphadenopathy:    She has no cervical adenopathy.  Neurological: She is alert and oriented to person, place, and time. She has normal reflexes. No cranial nerve deficit. She exhibits normal muscle tone. Coordination normal.  Skin: Skin is warm and dry. No rash noted. She is not diaphoretic. No erythema. No pallor.  Psychiatric: She has a normal mood and affect. Her behavior is normal. Judgment and thought content normal.  Nursing note and vitals reviewed.  Depression screen [facility] 2/9 05/07/2016 05/21/2015 04/17/2015 11/25/2014  Decreased Interest 0 0 0 0  Down, Depressed, Hopeless 0 0 0 0  PHQ - 2 Score 0 0 0 0        Assessment & Plan:   1. Routine physical examination   2. Screening for diabetes mellitus   3. Screening, lipid   4. Need for HPV vaccination  5. Need for prophylactic vaccination and inoculation against viral hepatitis   6. Need for Tdap vaccination   7. Class 1 obesity due to excess calories without serious comorbidity with body mass index (BMI) of 30.0 to 30.9 in adult   8. Depressive disorder   9. Splenomegaly   10. Hepatomegaly    -anticipatory guidance --- ongoing exercise, weight loss, safe sexual practices, safe driving practices. -obtain age appropriate screening labs. -s/p Gardisil #1, Hepatitis A#2, TDAP. -RTC two months for Gardisil #2. -anxiety and depression stable off of medication; sees therapist monthly. -s/p GI consultation [facility] for splenomegaly and hepatomegaly; extensive testing negative and presumed to be fatty liver; advised to RTC three months after  last visit in 06/05/2014 but has not; repeat LFTs today.  Ongoing weight loss was encouraged at that time.   Orders Placed This Encounter  Procedures  . HPV 9-valent vaccine,Recombinat  . Tdap vaccine greater than or equal to 7yo IM  . Hepatitis A vaccine adult IM  . CBC with Differential/Platelet  . Comprehensive metabolic panel    Order Specific Question:   Has the patient fasted?    Answer:   Yes  . Lipid panel    Order Specific Question:   Has the patient fasted?    Answer:   Yes  . Hemoglobin A1c  . TSH  . POCT urinalysis dipstick   No orders of the defined types were placed in this encounter.   Return in about 2 months (around 07/07/2016) for Gardisil 2.   Kristi Elayne Guerin, M.D. [facility] [address] [place], [place]  [address] [phone] phone [phone] fax

## 2016-05-10 ENCOUNTER — Encounter: Payer: Self-pay | Admitting: Nurse Practitioner

## 2016-05-10 ENCOUNTER — Ambulatory Visit: Payer: Managed Care, Other (non HMO) | Admitting: Nurse Practitioner

## 2016-05-10 ENCOUNTER — Ambulatory Visit (INDEPENDENT_AMBULATORY_CARE_PROVIDER_SITE_OTHER): Payer: Managed Care, Other (non HMO) | Admitting: Nurse Practitioner

## 2016-05-10 VITALS — BP 106/64 | HR 64 | Ht 66.5 in | Wt 193.0 lb

## 2016-05-10 DIAGNOSIS — D649 Anemia, unspecified: Secondary | ICD-10-CM | POA: Diagnosis not present

## 2016-05-10 DIAGNOSIS — Z202 Contact with and (suspected) exposure to infections with a predominantly sexual mode of transmission: Secondary | ICD-10-CM

## 2016-05-10 LAB — CBC
HCT: 37 % (ref 35.0–45.0)
Hemoglobin: 12 g/dL (ref 11.7–15.5)
MCH: 29 pg (ref 27.0–33.0)
MCHC: 32.4 g/dL (ref 32.0–36.0)
MCV: 89.4 fL (ref 80.0–100.0)
MPV: 9.7 fL (ref 7.5–12.5)
PLATELETS: 213 10*3/uL (ref 140–400)
RBC: 4.14 MIL/uL (ref 3.80–5.10)
RDW: 14 % (ref 11.0–15.0)
WBC: 8.5 10*3/uL (ref 3.8–10.8)

## 2016-05-10 LAB — HEMOGLOBIN, FINGERSTICK: Hemoglobin, fingerstick: 11.3 g/dL — ABNORMAL LOW (ref 12.0–16.0)

## 2016-05-10 NOTE — Progress Notes (Signed)
25 y.o. Single Caucasian female G0P0000 here for follow up of positive chlamydia in 01/2016.  She has  treated with Zithromax initiated on 02/07/16.  She was seen for AUB as well and was given Provera.  She then had a normal menses on 9/4 X 6 days; 10/6 and 11/4.  She does have a heavier menses every 2-3 months.  She seems to think they are tolerable.  She had been on OCP in the past and felt 'unwell' on hormones.  She has completed all medication as directed for treatment of chlamydia.  Denies any symptoms of abdominal pain, tenderness, or pain.  She has not been SA since 12/2015 and is not dating currently.    O: Healthy WD,WN female Affect: normal Abdomen:soft, non tender Pelvic exam:EXTERNAL GENITALIA: normal appearing vulva with no masses, tenderness or lesions VAGINA: no abnormal discharge or lesions CERVIX: no lesions or cervical motion tenderness UTERUS: normal ADNEXA: no masses palpable and non tender  HGB:  11.3  A: Chlamydia TOC  Menorrhagia some months - but tolerable  Mild anemia    P:  Discussed that she will be informed of test results and discussed again other BC options.  She is most comfortable with condoms and does not want hormones.  She is to start on OTC Slow Fe   Labs GC/ Ch;  Instructions given regarding: test results  RV

## 2016-05-10 NOTE — Patient Instructions (Signed)
Sexually Transmitted Disease A sexually transmitted disease (STD) is a disease or infection often passed to another person during sex. However, STDs can be passed through nonsexual ways. An STD can be passed through:  Spit (saliva).  Semen.  Blood.  Mucus from the vagina.  Pee (urine). How can I lessen my chances of getting an STD?  Use:  Latex condoms.  Water-soluble lubricants with condoms. Do not use petroleum jelly or oils.  Dental dams. These are small pieces of latex that are used as a barrier during oral sex.  Avoid having more than one sex partner.  Do not have sex with someone who has other sex partners.  Do not have sex with anyone you do not know or who is at high risk for an STD.  Avoid risky sex that can break your skin.  Do not have sex if you have open sores on your mouth or skin.  Avoid drinking too much alcohol or taking illegal drugs. Alcohol and drugs can affect your good judgment.  Avoid oral and anal sex acts.  Get shots (vaccines) for HPV and hepatitis.  If you are at risk of being infected with HIV, it is advised that you take a certain medicine daily to prevent HIV infection. This is called pre-exposure prophylaxis (PrEP). You may be at risk if:  You are a man who has sex with other men (MSM).  You are attracted to the opposite sex (heterosexual) and are having sex with more than one partner.  You take drugs with a needle.  You have sex with someone who has HIV.  Talk with your doctor about if you are at high risk of being infected with HIV. If you begin to take PrEP, get tested for HIV first. Get tested every 3 months for as long as you are taking PrEP.  Get tested for STDs every year if you are sexually active. If you are treated for an STD, get tested again 3 months after you are treated. What should I do if I think I have an STD?  See your doctor.  Tell your sex partner(s) that you have an STD. They should be tested and treated.  Do  not have sex until your doctor says it is okay. When should I get help? Get help right away if:  You have bad belly (abdominal) pain.  You are a man and have puffiness (swelling) or pain in your testicles.  You are a woman and have puffiness in your vagina. This information is not intended to replace advice given to you by your health care provider. Make sure you discuss any questions you have with your health care provider. Document Released: 07/15/2004 Document Revised: 11/13/2015 Document Reviewed: 12/01/2012 Elsevier Interactive Patient Education  2017 [employer] Inc[employer]  

## 2016-05-10 NOTE — Progress Notes (Deleted)
25 y.o. Single {Race/ethnicity:17218} female G0P0000 here with complaint of vaginal symptoms of itching, burning, and increase discharge. Describes discharge as ***. Onset of symptoms *** days ago. Denies new personal products or vaginal dryness. *** STD concerns. Urinary symptoms *** . Contraception is ***. After Provera challenge menses  had a normal menses 9/4 with 6 days. In 10/6 normal, 11/4 normal flow.   She has completed Zithromax.  Partner was treated. No ;onger dating this partner.  Not SA since 12/2015.25 y.o. {MARITAL STATUS:22092} {Race/ethnicity:17218} female G0P0000 here for follow up of ****** treated with ******* initiated on {DATE MONTH DAY WUJW:[id number]}YEAR:[id number]}. Completed all medication as directed.  Denies any symptoms of**************************.    O: Healthy WD,WN female Affect:  Skin: Abdomen:{Exam; abdomen brief:12273} Pelvic exam:{pe pelvic exam prenatal obgyn:314539}  A:****** Resolved     P: Discussed findings of   Labs   Instructions given regarding:  RV   O:  Healthy female WDWN Affect: normal, orientation x 3  Exam: Abdomen: Lymph node: no enlargement or tenderness Pelvic exam: External genital: normal female BUS: negative Vagina: *** discharge noted.  Affirm taken. Cervix: normal, non tender, no CMT Uterus: normal, non tender Adnexa:normal, non tender, no masses or fullness noted    A: Vaginitis   P: Discussed findings of vaginitis and etiology. Discussed Aveeno or baking soda sitz bath for comfort. Avoid moist clothes or pads for extended period of time. If working out in gym clothes or swim suits for long periods of time change underwear or bottoms of swimsuit if possible. Olive Oil/Coconut Oil use for skin protection prior to activity can be used to external skin.  Rx: ***  Follow with Affirm  Rv prn

## 2016-05-11 LAB — GC/CHLAMYDIA PROBE AMP
CT PROBE, AMP APTIMA: NOT DETECTED
GC Probe RNA: NOT DETECTED

## 2016-05-12 NOTE — Progress Notes (Signed)
Reviewed personally.  M. Suzanne Sidonie Dexheimer, MD.  

## 2016-05-18 ENCOUNTER — Other Ambulatory Visit: Payer: Self-pay | Admitting: Family Medicine

## 2016-05-18 DIAGNOSIS — Z23 Encounter for immunization: Secondary | ICD-10-CM

## 2016-06-11 ENCOUNTER — Encounter: Payer: Self-pay | Admitting: Nurse Practitioner

## 2016-06-11 ENCOUNTER — Ambulatory Visit (INDEPENDENT_AMBULATORY_CARE_PROVIDER_SITE_OTHER): Payer: Managed Care, Other (non HMO) | Admitting: Nurse Practitioner

## 2016-06-11 VITALS — BP 110/74 | HR 64 | Ht 65.5 in | Wt 189.0 lb

## 2016-06-11 DIAGNOSIS — Z0000 Encounter for general adult medical examination without abnormal findings: Secondary | ICD-10-CM

## 2016-06-11 DIAGNOSIS — Z01419 Encounter for gynecological examination (general) (routine) without abnormal findings: Secondary | ICD-10-CM

## 2016-06-11 LAB — POCT URINALYSIS DIPSTICK
Bilirubin, UA: NEGATIVE
Glucose, UA: NEGATIVE
Ketones, UA: NEGATIVE
NITRITE UA: NEGATIVE
PH UA: 5
PROTEIN UA: NEGATIVE
RBC UA: NEGATIVE
UROBILINOGEN UA: NEGATIVE

## 2016-06-11 NOTE — Patient Instructions (Signed)
General topics  Next pap or exam is  due in 1 year Take a Women's multivitamin Take 1200 mg. of calcium daily - prefer dietary If any concerns in interim to call back  Breast Self-Awareness Practicing breast self-awareness may pick up problems early, prevent significant medical complications, and possibly save your life. By practicing breast self-awareness, you can become familiar with how your breasts look and feel and if your breasts are changing. This allows you to notice changes early. It can also offer you some reassurance that your breast health is good. One way to learn what is normal for your breasts and whether your breasts are changing is to do a breast self-exam. If you find a lump or something that was not present in the past, it is best to contact your caregiver right away. Other findings that should be evaluated by your caregiver include nipple discharge, especially if it is bloody; skin changes or reddening; areas where the skin seems to be pulled in (retracted); or new lumps and bumps. Breast pain is seldom associated with cancer (malignancy), but should also be evaluated by a caregiver. BREAST SELF-EXAM The best time to examine your breasts is 5 7 days after your menstrual period is over.  ExitCare Patient Information 2013 [employer]Care[employer].   Exercise to Stay Healthy Exercise helps you become and stay healthy. EXERCISE IDEAS AND TIPS Choose exercises that:  You enjoy.  Fit into your day. You do not need to exercise really hard to be healthy. You can do exercises at a slow or medium level and stay healthy. You can:  Stretch before and after working out.  Try yoga, Pilates, or tai chi.  Lift weights.  Walk fast, swim, jog, run, climb stairs, bicycle, dance, or rollerskate.  Take aerobic classes. Exercises that burn about 150 calories:  Running 1  miles in 15 minutes.  Playing volleyball for 45 to 60 minutes.  Washing and waxing a car for 45 to 60  minutes.  Playing touch football for 45 minutes.  Walking 1  miles in 35 minutes.  Pushing a stroller 1  miles in 30 minutes.  Playing basketball for 30 minutes.  Raking leaves for 30 minutes.  Bicycling 5 miles in 30 minutes.  Walking 2 miles in 30 minutes.  Dancing for 30 minutes.  Shoveling snow for 15 minutes.  Swimming laps for 20 minutes.  Walking up stairs for 15 minutes.  Bicycling 4 miles in 15 minutes.  Gardening for 30 to 45 minutes.  Jumping rope for 15 minutes.  Washing windows or floors for 45 to 60 minutes. Document Released: 07/10/2010 Document Revised: 08/30/2011 Document Reviewed: 07/10/2010 ExitCare Patient Information 2013 ExitCare[employer].   Other topics ( that may be useful information):    Sexually Transmitted Disease Sexually transmitted disease (STD) refers to any infection that is passed from person to person during sexual activity. This may happen by way of saliva, semen, blood, vaginal mucus, or urine. Common STDs include:  Gonorrhea.  Chlamydia.  Syphilis.  HIV/AIDS.  Genital herpes.  Hepatitis B and C.  Trichomonas.  Human papillomavirus (HPV).  Pubic lice. CAUSES  An STD may be spread by bacteria, virus, or parasite. A person can get an STD by:  Sexual intercourse with an infected person.  Sharing sex toys with an infected person.  Sharing needles with an infected person.  Having intimate contact with the genitals, mouth, or rectal areas of an infected person. SYMPTOMS  Some people may not have any symptoms, but   they can still pass the infection to others. Different STDs have different symptoms. Symptoms include:  Painful or bloody urination.  Pain in the pelvis, abdomen, vagina, anus, throat, or eyes.  Skin rash, itching, irritation, growths, or sores (lesions). These usually occur in the genital or anal area.  Abnormal vaginal discharge.  Penile discharge in men.  Soft, flesh-colored skin growths in the  genital or anal area.  Fever.  Pain or bleeding during sexual intercourse.  Swollen glands in the groin area.  Yellow skin and eyes (jaundice). This is seen with hepatitis. DIAGNOSIS  To make a diagnosis, your caregiver may:  Take a medical history.  Perform a physical exam.  Take a specimen (culture) to be examined.  Examine a sample of discharge under a microscope.  Perform blood test TREATMENT   Chlamydia, gonorrhea, trichomonas, and syphilis can be cured with antibiotic medicine.  Genital herpes, hepatitis, and HIV can be treated, but not cured, with prescribed medicines. The medicines will lessen the symptoms.  Genital warts from HPV can be treated with medicine or by freezing, burning (electrocautery), or surgery. Warts may come back.  HPV is a virus and cannot be cured with medicine or surgery.However, abnormal areas may be followed very closely by your caregiver and may be removed from the cervix, vagina, or vulva through office procedures or surgery. If your diagnosis is confirmed, your recent sexual partners need treatment. This is true even if they are symptom-free or have a negative culture or evaluation. They should not have sex until their caregiver says it is okay. HOME CARE INSTRUCTIONS  All sexual partners should be informed, tested, and treated for all STDs.  Take your antibiotics as directed. Finish them even if you start to feel better.  Only take over-the-counter or prescription medicines for pain, discomfort, or fever as directed by your caregiver.  Rest.  Eat a balanced diet and drink enough fluids to keep your urine clear or pale yellow.  Do not have sex until treatment is completed and you have followed up with your caregiver. STDs should be checked after treatment.  Keep all follow-up appointments, Pap tests, and blood tests as directed by your caregiver.  Only use latex condoms and water-soluble lubricants during sexual activity. Do not use  petroleum jelly or oils.  Avoid alcohol and illegal drugs.  Get vaccinated for HPV and hepatitis. If you have not received these vaccines in the past, talk to your caregiver about whether one or both might be right for you.  Avoid risky sex practices that can break the skin. The only way to avoid getting an STD is to avoid all sexual activity.Latex condoms and dental dams (for oral sex) will help lessen the risk of getting an STD, but will not completely eliminate the risk. SEEK MEDICAL CARE IF:   You have a fever.  You have any new or worsening symptoms. Document Released: 08/28/2002 Document Revised: 08/30/2011 Document Reviewed: 09/04/2010 [facility] Patient Information 2013 [employer].    Domestic Abuse You are being battered or abused if someone close to you hits, pushes, or physically hurts you in any way. You also are being abused if you are forced into activities. You are being sexually abused if you are forced to have sexual contact of any kind. You are being emotionally abused if you are made to feel worthless or if you are constantly threatened. It is important to remember that help is available. No one has the right to abuse you. PREVENTION OF FURTHER  ABUSE  Learn the warning signs of danger. This varies with situations but may include: the use of alcohol, threats, isolation from friends and family, or forced sexual contact. Leave if you feel that violence is going to occur.  If you are attacked or beaten, report it to the police so the abuse is documented. You do not have to press charges. The police can protect you while you or the attackers are leaving. Get the officer's name and badge number and a copy of the report.  Find someone you can trust and tell them what is happening to you: your caregiver, a nurse, clergy member, close friend or family member. Feeling ashamed is natural, but remember that you have done nothing wrong. No one deserves abuse. Document Released:  06/04/2000 Document Revised: 08/30/2011 Document Reviewed: 08/13/2010 ExitCare Patient Information 2013 [employer].    How Much is Too Much Alcohol? Drinking too much alcohol can cause injury, accidents, and health problems. These types of problems can include:   Car crashes.  Falls.  Family fighting (domestic violence).  Drowning.  Fights.  Injuries.  Burns.  Damage to certain organs.  Having a baby with birth defects. ONE DRINK CAN BE TOO MUCH WHEN YOU ARE:  Working.  Pregnant or breastfeeding.  Taking medicines. Ask your doctor.  Driving or planning to drive. If you or someone you know has a drinking problem, get help from a doctor.  Document Released: 04/03/2009 Document Revised: 08/30/2011 Document Reviewed: 04/03/2009 ExitCare Patient Information 2013 [employer]Care[employer].   Smoking Hazards Smoking cigarettes is extremely bad for your health. Tobacco smoke has over 200 known poisons in it. There are over 60 chemicals in tobacco smoke that cause cancer. Some of the chemicals found in cigarette smoke include:   Cyanide.  Benzene.  Formaldehyde.  Methanol (wood alcohol).  Acetylene (fuel used in welding torches).  Ammonia. Cigarette smoke also contains the poisonous gases nitrogen oxide and carbon monoxide.  Cigarette smokers have an increased risk of many serious medical problems and Smoking causes approximately:  90% of all lung cancer deaths in men.  80% of all lung cancer deaths in women.  90% of deaths from chronic obstructive lung disease. Compared with nonsmokers, smoking increases the risk of:  Coronary heart disease by 2 to 4 times.  Stroke by 2 to 4 times.  Men developing lung cancer by 23 times.  Women developing lung cancer by 13 times.  Dying from chronic obstructive lung diseases by 12 times.  . Smoking is the most preventable cause of death and disease in our society.  WHY IS SMOKING ADDICTIVE?  Nicotine is the chemical  agent in tobacco that is capable of causing addiction or dependence.  When you smoke and inhale, nicotine is absorbed rapidly into the bloodstream through your lungs. Nicotine absorbed through the lungs is capable of creating a powerful addiction. Both inhaled and non-inhaled nicotine may be addictive.  Addiction studies of cigarettes and spit tobacco show that addiction to nicotine occurs mainly during the teen years, when young people begin using tobacco products. WHAT ARE THE BENEFITS OF QUITTING?  There are many health benefits to quitting smoking.   Likelihood of developing cancer and heart disease decreases. Health improvements are seen almost immediately.  Blood pressure, pulse rate, and breathing patterns start returning to normal soon after quitting. QUITTING SMOKING   American Lung Association - 1-800-LUNGUSA  American Cancer Society - 1-800-ACS-2345 Document Released: 07/15/2004 Document Revised: 08/30/2011 Document Reviewed: 03/19/2009 ExitCare Patient Information 2013 ExitCare,   LLC.   Stress Management Stress is a state of physical or mental tension that often results from changes in your life or normal routine. Some common causes of stress are:  Death of a loved one.  Injuries or severe illnesses.  Getting fired or changing jobs.  Moving into a new home. Other causes may be:  Sexual problems.  Business or financial losses.  Taking on a large debt.  Regular conflict with someone at home or at work.  Constant tiredness from lack of sleep. It is not just bad things that are stressful. It may be stressful to:  Win the lottery.  Get married.  Buy a new car. The amount of stress that can be easily tolerated varies from person to person. Changes generally cause stress, regardless of the types of change. Too much stress can affect your health. It may lead to physical or emotional problems. Too little stress (boredom) may also become stressful. SUGGESTIONS TO  REDUCE STRESS:  Talk things over with your family and friends. It often is helpful to share your concerns and worries. If you feel your problem is serious, you may want to get help from a professional counselor.  Consider your problems one at a time instead of lumping them all together. Trying to take care of everything at once may seem impossible. List all the things you need to do and then start with the most important one. Set a goal to accomplish 2 or 3 things each day. If you expect to do too many in a single day you will naturally fail, causing you to feel even more stressed.  Do not use alcohol or drugs to relieve stress. Although you may feel better for a short time, they do not remove the problems that caused the stress. They can also be habit forming.  Exercise regularly - at least 3 times per week. Physical exercise can help to relieve that "uptight" feeling and will relax you.  The shortest distance between despair and hope is often a good night's sleep.  Go to bed and get up on time allowing yourself time for appointments without being rushed.  Take a short "time-out" period from any stressful situation that occurs during the day. Close your eyes and take some deep breaths. Starting with the muscles in your face, tense them, hold it for a few seconds, then relax. Repeat this with the muscles in your neck, shoulders, hand, stomach, back and legs.  Take good care of yourself. Eat a balanced diet and get plenty of rest.  Schedule time for having fun. Take a break from your daily routine to relax. HOME CARE INSTRUCTIONS   Call if you feel overwhelmed by your problems and feel you can no longer manage them on your own.  Return immediately if you feel like hurting yourself or someone else. Document Released: 12/01/2000 Document Revised: 08/30/2011 Document Reviewed: 07/24/2007 ExitCare Patient Information 2013 ExitCare, LLC.  

## 2016-06-11 NOTE — Progress Notes (Signed)
Patient ID: Melanie Crosby, female   DOB: 26-Jun-1990, 26 y.o.   MRN: [id number]  25 y.o. G0P0000 Single  Caucasian Fe here for annual exam.  Currently no mid cycle BTB.  [facility]  Menses now at 6 days.  Still heavy for 2 days, then moderate to light.  Heavy day = regular pad changing 3-4 hours. Some clots. Some cramps.  Gets relief with OTC NSAID's.  Same partner for 3 yrs.  No STD concerns,  She was negative for Chlamydia on 11/20 which was @ TOC.  Patient's last menstrual period was 05/23/2016 (exact date).          Sexually active: No.  The current method of family planning is condoms all of the time.    Exercising: Yes.    Zumba Smoker:  no  Health Maintenance: Pap:  01/25/14, Negative TDaP: 05/07/16 HPV-9: 05/07/16 #1 HIV: 02/06/16 Labs: 05/10/16  Urine: trace leuk's   reports that she has never smoked. She has never used smokeless tobacco. She reports that she does not drink alcohol or use drugs.  Past Medical History:  Diagnosis Date  . Anxiety   . Depression   . Hepatosplenomegaly 03/2013   Uncertain etiology    Past Surgical History:  Procedure Laterality Date  . APPENDECTOMY  2013    No current outpatient prescriptions on file.   No current facility-administered medications for this visit.     Family History  Problem Relation Age of Onset  . Multiple sclerosis Mother   . Thyroid disease Mother   . Hypertension Father   . Mental illness Father   . Mental illness Brother   . Diabetes Maternal Grandmother   . Heart disease Maternal Grandfather   . Skin cancer Maternal Grandfather   . Breast cancer Maternal Aunt     Mid 50's    ROS:  Pertinent items are noted in HPI.  Otherwise, a comprehensive ROS was negative.  Exam:   BP 110/74 (BP Location: Right Arm, Patient Position: Sitting, Cuff Size: Normal)   Pulse 64   Ht 5' 5.5" (1.664 m)   Wt 189 lb (85.7 kg)   LMP 05/23/2016 (Exact Date)   BMI 30.97 kg/m  Height: 5' 5.5" (166.4 cm) Ht Readings from Last  3 Encounters:  06/11/16 5' 5.5" (1.664 m)  05/10/16 5' 6.5" (1.689 m)  05/07/16 5' 6.5" (1.689 m)    General appearance: alert, cooperative and appears stated age Head: Normocephalic, without obvious abnormality, atraumatic Neck: no adenopathy, supple, symmetrical, trachea midline and thyroid normal to inspection and palpation Lungs: clear to auscultation bilaterally Breasts: normal appearance, no masses or tenderness Heart: regular rate and rhythm Abdomen: soft, non-tender; no masses,  no organomegaly Extremities: extremities normal, atraumatic, no cyanosis or edema Skin: Skin color, texture, turgor normal. No rashes or lesions Lymph nodes: Cervical, supraclavicular, and axillary nodes normal. No abnormal inguinal nodes palpated Neurologic: Grossly normal   Pelvic: External genitalia:  no lesions              Urethra:  normal appearing urethra with no masses, tenderness or lesions              Bartholin's and Skene's: normal                 Vagina: normal appearing vagina with normal color and discharge, no lesions              Cervix: anteverted  Pap taken: Yes.   Bimanual Exam:  Uterus:  normal size, contour, position, consistency, mobility, non-tender              Adnexa: no mass, fullness, tenderness               Rectovaginal: Confirms               Anus:  normal sphincter tone, no lesions  Chaperone present: yes  A:  Well Woman with normal exam  Condoms for birth control and happy with choice  Previous OCP with BTB  History of + chlamydia with TOC/ STD's 04/2016  Declines STD's today   P:   Reviewed health and wellness pertinent to exam  Pap smear was done  Will follow with pap  Counseled on breast self exam, STD prevention, HIV risk factors and prevention, adequate intake of calcium and vitamin D, diet and exercise return annually or prn  An After Visit Summary was printed and given to the patient.

## 2016-06-15 LAB — IPS PAP TEST WITH REFLEX TO HPV

## 2016-06-15 NOTE — Progress Notes (Signed)
Encounter reviewed by Dr. Brook Amundson C. Silva.  

## 2016-06-16 ENCOUNTER — Other Ambulatory Visit: Payer: Self-pay | Admitting: Nurse Practitioner

## 2016-06-16 MED ORDER — METRONIDAZOLE 0.75 % VA GEL: 1.0000 | Freq: Every day | VAGINAL | 0 refills | Status: AC

## 2016-06-16 NOTE — Progress Notes (Unsigned)
meds to pharmacy

## 2016-06-17 ENCOUNTER — Ambulatory Visit: Payer: Managed Care, Other (non HMO) | Admitting: Psychology

## 2016-07-15 ENCOUNTER — Ambulatory Visit (INDEPENDENT_AMBULATORY_CARE_PROVIDER_SITE_OTHER): Payer: Managed Care, Other (non HMO) | Admitting: Psychology

## 2016-07-15 DIAGNOSIS — F4323 Adjustment disorder with mixed anxiety and depressed mood: Secondary | ICD-10-CM

## 2016-07-16 ENCOUNTER — Ambulatory Visit: Payer: Managed Care, Other (non HMO)

## 2016-07-16 DIAGNOSIS — Z23 Encounter for immunization: Secondary | ICD-10-CM

## 2016-07-16 NOTE — Progress Notes (Unsigned)
Patient was here for 2nd gardasil vaccine.  Patient received it in the

## 2016-08-12 ENCOUNTER — Ambulatory Visit: Payer: Managed Care, Other (non HMO) | Admitting: Psychology

## 2016-09-09 ENCOUNTER — Ambulatory Visit (INDEPENDENT_AMBULATORY_CARE_PROVIDER_SITE_OTHER): Payer: Managed Care, Other (non HMO) | Admitting: Psychology

## 2016-09-09 DIAGNOSIS — F4323 Adjustment disorder with mixed anxiety and depressed mood: Secondary | ICD-10-CM

## 2016-10-14 ENCOUNTER — Ambulatory Visit (INDEPENDENT_AMBULATORY_CARE_PROVIDER_SITE_OTHER): Payer: Managed Care, Other (non HMO) | Admitting: Psychology

## 2016-10-14 DIAGNOSIS — F4323 Adjustment disorder with mixed anxiety and depressed mood: Secondary | ICD-10-CM

## 2016-12-02 ENCOUNTER — Encounter: Payer: Self-pay | Admitting: Family Medicine

## 2016-12-02 ENCOUNTER — Ambulatory Visit (INDEPENDENT_AMBULATORY_CARE_PROVIDER_SITE_OTHER): Payer: Managed Care, Other (non HMO) | Admitting: Family Medicine

## 2016-12-02 VITALS — BP 118/74 | HR 68 | Temp 98.7°F | Resp 16 | Ht 65.5 in | Wt 181.6 lb

## 2016-12-02 DIAGNOSIS — J029 Acute pharyngitis, unspecified: Secondary | ICD-10-CM

## 2016-12-02 DIAGNOSIS — Z23 Encounter for immunization: Secondary | ICD-10-CM | POA: Diagnosis not present

## 2016-12-02 LAB — POCT RAPID STREP A (OFFICE): RAPID STREP A SCREEN: NEGATIVE

## 2016-12-02 NOTE — Progress Notes (Signed)
   Melanie Crosby is a 26 y.o. female who presents to [facility] Care at [facility] today for sore throat:  1.  Sore throat:  Patient has long history of the same. She is previously diagnosed with mononucleosis about 3-4 years ago. She had hepatosplenomegaly secondary to this. Elevated LFTs at that time which are now since returned back to normal. She received multiple antibiotics for strep infection after recurrent episodes of sore throat. Last of these was back in November 2016.  She describes "throat irritation" that started on Saturday which is about 5 days ago. Has continued since then. Describes no real pain but just general sense of irritation noticeable when she is swallowing. She looked in a mirror to some "white stuff" in the back of her throat. She feels this has gradually improved over the week. Wanted to come to be evaluated since it was still present.  No fevers or chills. Eating and drinking well. No URI symptoms. No cough. No glandular swelling in her neck or throat that she has noticed.  No weight loss.  No night sweats.    ROS as above  PMH reviewed. Patient is a nonsmoker.   Past Medical History:  Diagnosis Date  . Anxiety   . Depression   . Hepatosplenomegaly 03/2013   Uncertain etiology   Past Surgical History:  Procedure Laterality Date  . APPENDECTOMY  2013    Medications reviewed. Current Outpatient Prescriptions  Medication Sig Dispense Refill  . OVER THE COUNTER MEDICATION     . metroNIDAZOLE (METROGEL) 0.75 % vaginal gel Place 1 Applicatorful vaginally at bedtime. (Patient not taking: Reported on 12/02/2016) 70 g 0   No current facility-administered medications for this visit.      Physical Exam:  BP 118/74   Pulse 68   Temp 98.7 F (37.1 C) (Oral)   Resp 16   Ht 5' 5.5" (1.664 m)   Wt 181 lb 9.6 oz (82.4 kg)   SpO2 98%   BMI 29.76 kg/m  Gen:  Patient sitting on exam table, appears stated age in no acute distress Head: Normocephalic atraumatic Eyes:  EOMI, PERRL, sclera and conjunctiva non-erythematous Nose:  Nasal turbinates WNL without drainage/exudates Mouth: Mucosa membranes moist. Tonsils non-enlarged but Erythematous and white plaques somewhat consistent with pseudomembrane appearance. Mostly on tonsils. Some of the posterior oropharynx. Neck: No cervical lymphadenopathy noted. Nontender throat exam with palpation of neck Heart:  RRR, no murmurs auscultated. Pulm:  Clear to auscultation bilaterally with good air movement.  No wheezes or rales noted.      Results for orders placed or performed in visit on 12/02/16  POCT rapid strep A  Result Value Ref Range   Rapid Strep A Screen Negative Negative    Assessment and Plan:  1.  Sore throat: - looks like oropharngyeal candidiasis.   - sending swab for culture.  If negative, will treat with nystatin.  - she has had a negative HIV.  No current signs of symptoms of immunocompromise.   2.  Gardasil - also needs third and final Gardasil vaccine  - provided today in clinic.

## 2016-12-02 NOTE — Patient Instructions (Addendum)
Gardasil provided today.  We are sending your throat swab for culture.  I will let you know these results tomorrow.  If negative, we will treat as yeast infection, which is what it looks like.  It was good to meet you today!    IF you received an x-ray today, you will receive an invoice from [facility]. Please contact [facility] at [phone] with questions or concerns regarding your invoice.   IF you received labwork today, you will receive an invoice from [facility]. Please contact [facility] at [phone] with questions or concerns regarding your invoice.   Our billing staff will not be able to assist you with questions regarding bills from these companies.  You will be contacted with the lab results as soon as they are available. The fastest way to get your results is to activate your My Chart account. Instructions are located on the last page of this paperwork. If you have not heard from us regarding the results in 2 weeks, please contact this office.

## 2016-12-05 LAB — CULTURE, GROUP A STREP

## 2016-12-06 ENCOUNTER — Telehealth: Payer: Self-pay | Admitting: Family Medicine

## 2016-12-06 MED ORDER — AMOXICILLIN 500 MG PO CAPS: 500.0000 mg | ORAL_CAPSULE | Freq: Three times a day (TID) | ORAL | 0 refills | Status: AC

## 2016-12-06 NOTE — Telephone Encounter (Signed)
Called and had to leave message about beta-hemolytic strep.  Treating based on symptoms.  Amoxicillin TID x 7 days.   To call if any questions.

## 2017-06-20 ENCOUNTER — Ambulatory Visit: Payer: Managed Care, Other (non HMO) | Admitting: Nurse Practitioner

## 2017-11-04 ENCOUNTER — Encounter: Payer: Self-pay | Admitting: Family Medicine

## 2017-11-09 ENCOUNTER — Encounter: Payer: Self-pay | Admitting: Family Medicine

## 2022-11-17 ENCOUNTER — Ambulatory Visit
Admission: RE | Admit: 2022-11-17 | Discharge: 2022-11-17 | Disposition: A | Discharge status: Home / Self Care | Payer: Managed Care, Other (non HMO) | Source: Ambulatory Visit

## 2022-11-17 VITALS — BP 124/78 | HR 81 | Temp 98.7°F | Resp 16 | Ht 66.0 in | Wt 190.0 lb

## 2022-11-17 DIAGNOSIS — M25512 Pain in left shoulder: Secondary | ICD-10-CM | POA: Diagnosis not present

## 2022-11-17 DIAGNOSIS — S43402A Unspecified sprain of left shoulder joint, initial encounter: Secondary | ICD-10-CM | POA: Diagnosis not present

## 2022-11-17 MED ORDER — METAXALONE 800 MG PO TABS: 800.0000 mg | ORAL_TABLET | Freq: Three times a day (TID) | ORAL | 0 refills | Status: AC

## 2022-11-17 MED ORDER — KETOROLAC TROMETHAMINE 60 MG/2ML IM SOLN
60.0000 mg | Freq: Once | INTRAMUSCULAR | Status: AC
  Administered 2022-11-17: 60 mg via INTRAMUSCULAR

## 2022-11-17 MED ORDER — CELECOXIB 100 MG PO CAPS: 100.0000 mg | ORAL_CAPSULE | Freq: Two times a day (BID) | ORAL | 0 refills | Status: AC

## 2022-11-17 NOTE — ED Provider Notes (Signed)
[facility]    CSN: [id number] Arrival date & time: 11/17/22  1527      History   Chief Complaint Chief Complaint  Patient presents with   Shoulder Pain    Pain radiating through shoulder/back/chest - Entered by patient    HPI Melanie Crosby is a 32 y.o. female.   HPI Very pleasant 32 year old female presents with shoulder pain radiating from back to chest.  Patient reports has been moving and lifting heavy objects for the past 3 days after closing on her home and moving.  Patient reports taking ibuprofen at 4 AM.  PMH significant for obesity, hepatomegaly, splenomegaly, and anxiety  Past Medical History:  Diagnosis Date   Anxiety    Depression    Hepatosplenomegaly 03/2013   Uncertain etiology    Patient Active Problem List   Diagnosis Date Noted   Depressive disorder 07/09/2013   Obesity 07/09/2013   Hepatomegaly 05/28/2013   Splenomegaly 03/21/2013    Past Surgical History:  Procedure Laterality Date   APPENDECTOMY  2013    OB History     Gravida  0   Para  0   Term  0   Preterm  0   AB  0   Living  0      SAB  0   IAB  0   Ectopic  0   Multiple  0   Live Births  0            Home Medications    Prior to Admission medications   Medication Sig Start Date End Date Taking? Authorizing Provider  celecoxib (CELEBREX) 100 MG capsule Take 1 capsule (100 mg total) by mouth 2 (two) times daily for 15 days. 11/17/22 12/02/22 Yes Trevor Iha, FNP  metaxalone (SKELAXIN) 800 MG tablet Take 1 tablet (800 mg total) by mouth 3 (three) times daily for 10 days. 11/17/22 11/27/22 Yes Trevor Iha, FNP  norethindrone (AYGESTIN) 5 MG tablet Take by mouth. 07/06/22  Yes [provider]  amoxicillin (AMOXIL) 500 MG capsule Take 1 capsule (500 mg total) by mouth 3 (three) times daily. 12/06/16   Tobey Grim, MD  metroNIDAZOLE (METROGEL) 0.75 % vaginal gel Place 1 Applicatorful vaginally at bedtime. Patient not taking: Reported on  12/02/2016 06/16/16   Ria Comment, FNP  OVER THE COUNTER MEDICATION     [provider]    Family History Family History  Problem Relation Age of Onset   Multiple sclerosis Mother    Thyroid disease Mother    Hypertension Father    Mental illness Father    Mental illness Brother    Diabetes Maternal Grandmother    Heart disease Maternal Grandfather    Skin cancer Maternal Grandfather    Breast cancer Maternal Aunt        Mid 52's    Social History Social History   Tobacco Use   Smoking status: Never   Smokeless tobacco: Never  Vaping Use   Vaping Use: Never used  Substance Use Topics   Alcohol use: No   Drug use: No     Allergies   Patient has no known allergies.   Review of Systems Review of Systems  Musculoskeletal:        Reports radiating to shoulder/ back/chest pain     Physical Exam Triage Vital Signs ED Triage Vitals  Enc Vitals Group     BP      Pulse      Resp  Temp      Temp src      SpO2      Weight      Height      Head Circumference      Peak Flow      Pain Score      Pain Loc      Pain Edu?      Excl. in GC?    No data found.  Updated Vital Signs BP 124/78 (BP Location: Right Arm)   Pulse 81   Temp 98.7 F (37.1 C) (Oral)   Resp 16   Ht 5\' 6"  (1.676 m)   Wt 190 lb (86.2 kg)   SpO2 97%   BMI 30.67 kg/m    Physical Exam Vitals and nursing note reviewed.  Constitutional:      Appearance: Normal appearance. She is obese.  HENT:     Mouth/Throat:     Mouth: Mucous membranes are moist.     Pharynx: Oropharynx is clear.  Eyes:     Extraocular Movements: Extraocular movements intact.     Conjunctiva/sclera: Conjunctivae normal.     Pupils: Pupils are equal, round, and reactive to light.  Cardiovascular:     Rate and Rhythm: Normal rate and regular rhythm.     Pulses: Normal pulses.     Heart sounds: Normal heart sounds.  Pulmonary:     Effort: Pulmonary effort is normal.     Breath sounds: Normal  breath sounds. No wheezing, rhonchi or rales.  Musculoskeletal:        General: Normal range of motion.     Cervical back: Normal range of motion and neck supple.     Comments: Left shoulder (anterior aspect): TTP over GH and AC joints range of motion intact, no deformity noted  Skin:    General: Skin is warm and dry.  Neurological:     General: No focal deficit present.     Mental Status: She is alert and oriented to person, place, and time. Mental status is at baseline.  Psychiatric:        Mood and Affect: Mood normal.        Behavior: Behavior normal.      UC Treatments / Results  Labs (all labs ordered are listed, but only abnormal results are displayed) Labs Reviewed - No data to display  EKG   Radiology No results found.  Procedures Procedures (including critical care time)  Medications Ordered in UC Medications  ketorolac (TORADOL) injection 60 mg (60 mg Intramuscular Given 11/17/22 1708)    Initial Impression / Assessment and Plan / UC Course  I have reviewed the triage vital signs and the nursing notes.  Pertinent labs & imaging results that were available during my care of the patient were reviewed by me and considered in my medical decision making (see chart for details).     MDM: 1.  Sprain of left shoulder, unspecified shoulder sprain type, initial encounter-IM Toradol 60 mg given once in clinic prior to discharge, Rx'd Skelaxin 800 mg 3 times daily, as needed; 2.  Left shoulder pain, unspecified chronicity-Rx'd Celebrex 100 mg capsule twice daily x 15 days. Advised patient to take medication as directed with food to completion.  Advised patient may take Skelaxin daily or as needed for accompanying muscle spasms of left shoulder region.  Encouraged patient to increase daily water intake to 64 ounces per day while taking these medications.  Advised patient if symptoms worsen and/or unresolved please follow-up with PCP  or here for further evaluation.  Final  Clinical Impressions(s) / UC Diagnoses   Final diagnoses:  Sprain of left shoulder, unspecified shoulder sprain type, initial encounter  Left shoulder pain, unspecified chronicity     Discharge Instructions      Advised patient to take medication as directed with food to completion.  Advised patient may take Skelaxin daily or as needed for accompanying muscle spasms of left shoulder region.  Encouraged patient to increase daily water intake to 64 ounces per day while taking these medications.  Advised patient if symptoms worsen and/or unresolved please follow-up with PCP or here for further evaluation.     ED Prescriptions     Medication Sig Dispense Auth. Provider   celecoxib (CELEBREX) 100 MG capsule Take 1 capsule (100 mg total) by mouth 2 (two) times daily for 15 days. 30 capsule Trevor Iha, FNP   metaxalone (SKELAXIN) 800 MG tablet Take 1 tablet (800 mg total) by mouth 3 (three) times daily for 10 days. 30 tablet Trevor Iha, FNP      PDMP not reviewed this encounter.   Trevor Iha, FNP 11/17/22 [phone]

## 2022-11-17 NOTE — ED Triage Notes (Signed)
Patient states that she's been moving, lifting heavy objects.  Having left shoulder pain x 3 days.  Worse at night but this morning, pain became unbearable.  Patient has taken Ibuprofen @ 4am.

## 2022-11-17 NOTE — Discharge Instructions (Addendum)
Advised patient to take medication as directed with food to completion.  Advised patient may take Skelaxin daily or as needed for accompanying muscle spasms of left shoulder region.  Encouraged patient to increase daily water intake to 64 ounces per day while taking these medications.  Advised patient if symptoms worsen and/or unresolved please follow-up with PCP or here for further evaluation.

## 2023-05-02 ENCOUNTER — Ambulatory Visit
Admission: RE | Admit: 2023-05-02 | Discharge: 2023-05-02 | Disposition: A | Discharge status: Home / Self Care | Payer: Managed Care, Other (non HMO) | Source: Ambulatory Visit | Attending: Family Medicine | Admitting: Family Medicine

## 2023-05-02 VITALS — BP 120/88 | HR 111 | Temp 98.1°F | Resp 16

## 2023-05-02 DIAGNOSIS — M25512 Pain in left shoulder: Secondary | ICD-10-CM

## 2023-05-02 DIAGNOSIS — S46912A Strain of unspecified muscle, fascia and tendon at shoulder and upper arm level, left arm, initial encounter: Secondary | ICD-10-CM | POA: Diagnosis not present

## 2023-05-02 NOTE — Discharge Instructions (Addendum)
Advised patient to follow-up with [facility] orthopedics for further evaluation of left shoulder pain.  Advised patient may take OTC Ibuprofen (600-800 mg daily or as needed) alternating with OTC Tylenol (1 g daily or as needed) for left shoulder pain.  Encouraged to increase daily water intake to 64 ounces per day while taking these medications.

## 2023-05-02 NOTE — ED Provider Notes (Signed)
[facility]    CSN: [id number] Arrival date & time: 05/02/23  1546      History   Chief Complaint Chief Complaint  Patient presents with   Diarrhea    HPI Melanie Crosby is a 32 y.o. female.   HPI 32 year old female presents with abdominal pain for days.  PMH significant for obesity, hepatosplenomegaly, and anxiety.  Past Medical History:  Diagnosis Date   Anxiety    Depression    Hepatosplenomegaly 03/2013   Uncertain etiology    Patient Active Problem List   Diagnosis Date Noted   Depressive disorder 07/09/2013   Obesity 07/09/2013   Hepatomegaly 05/28/2013   Splenomegaly 03/21/2013    Past Surgical History:  Procedure Laterality Date   APPENDECTOMY  2013    OB History     Gravida  0   Para  0   Term  0   Preterm  0   AB  0   Living  0      SAB  0   IAB  0   Ectopic  0   Multiple  0   Live Births  0            Home Medications    Prior to Admission medications   Medication Sig Start Date End Date Taking? Authorizing Provider  amoxicillin (AMOXIL) 500 MG capsule Take 1 capsule (500 mg total) by mouth 3 (three) times daily. 12/06/16   Tobey Grim, MD  metroNIDAZOLE (METROGEL) 0.75 % vaginal gel Place 1 Applicatorful vaginally at bedtime. Patient not taking: Reported on 12/02/2016 06/16/16   Ria Comment, FNP  norethindrone (AYGESTIN) 5 MG tablet Take by mouth. 07/06/22   [provider]  OVER THE COUNTER MEDICATION     [provider]    Family History Family History  Problem Relation Age of Onset   Multiple sclerosis Mother    Thyroid disease Mother    Hypertension Father    Mental illness Father    Mental illness Brother    Diabetes Maternal Grandmother    Heart disease Maternal Grandfather    Skin cancer Maternal Grandfather    Breast cancer Maternal Aunt        Mid 46's    Social History Social History   Tobacco Use   Smoking status: Never   Smokeless tobacco: Never   Vaping Use   Vaping status: Never Used  Substance Use Topics   Alcohol use: No   Drug use: No     Allergies   Patient has no known allergies.   Review of Systems Review of Systems  Musculoskeletal:        Left shoulder pain that radiates from posterior left shoulder to left axilla     Physical Exam Triage Vital Signs ED Triage Vitals  Encounter Vitals Group     BP      Systolic BP Percentile      Diastolic BP Percentile      Pulse      Resp      Temp      Temp src      SpO2      Weight      Height      Head Circumference      Peak Flow      Pain Score      Pain Loc      Pain Education      Exclude from Growth Chart    No data found.  Updated  Vital Signs BP 120/88   Pulse (!) 111   Temp 98.1 F (36.7 C)   Resp 16   SpO2 98%    Physical Exam Vitals and nursing note reviewed.  Constitutional:      Appearance: Normal appearance. She is obese. She is not ill-appearing.  HENT:     Head: Normocephalic and atraumatic.     Mouth/Throat:     Mouth: Mucous membranes are moist.     Pharynx: Oropharynx is clear.  Eyes:     Extraocular Movements: Extraocular movements intact.     Conjunctiva/sclera: Conjunctivae normal.     Pupils: Pupils are equal, round, and reactive to light.  Cardiovascular:     Rate and Rhythm: Normal rate and regular rhythm.     Pulses: Normal pulses.     Heart sounds: Normal heart sounds.  Pulmonary:     Effort: Pulmonary effort is normal.     Breath sounds: Normal breath sounds. No wheezing, rhonchi or rales.  Musculoskeletal:        General: Normal range of motion.     Cervical back: Normal range of motion and neck supple.     Comments: Patient reporting pain over posterior aspect of left shoulder radiating to inferior left axilla, no deformity noted, full range of motion intact  Skin:    General: Skin is warm and dry.  Neurological:     General: No focal deficit present.     Mental Status: She is alert and oriented to  person, place, and time. Mental status is at baseline.      UC Treatments / Results  Labs (all labs ordered are listed, but only abnormal results are displayed) Labs Reviewed - No data to display  EKG   Radiology No results found.  Procedures Procedures (including critical care time)  Medications Ordered in UC Medications - No data to display  Initial Impression / Assessment and Plan / UC Course  I have reviewed the triage vital signs and the nursing notes.  Pertinent labs & imaging results that were available during my care of the patient were reviewed by me and considered in my medical decision making (see chart for details).     MDM: 1.  Left shoulder strain, initial encounter-Advised patient to follow-up with [facility] orthopedics for further evaluation of left shoulder pain.  2.  Left shoulder pain, unspecified chronicity-Advised patient may take OTC Ibuprofen (600-800 mg daily or as needed) alternating with OTC Tylenol (1 g daily or as needed) for left shoulder pain.  Encouraged to increase daily water intake to 64 ounces per day while taking these medications.  Patient discharged home, hemodynamically stable. Final Clinical Impressions(s) / UC Diagnoses   Final diagnoses:  Left shoulder strain, initial encounter  Left shoulder pain, unspecified chronicity     Discharge Instructions      Advised patient to follow-up with [facility] orthopedics for further evaluation of left shoulder pain.  Advised patient may take OTC Ibuprofen (600-800 mg daily or as needed) alternating with OTC Tylenol (1 g daily or as needed) for left shoulder pain.  Encouraged to increase daily water intake to 64 ounces per day while taking these medications.     ED Prescriptions   None    PDMP not reviewed this encounter.   Trevor Iha, FNP 05/02/23 1658

## 2023-05-02 NOTE — ED Triage Notes (Addendum)
Pt presents to uc with co of left side pain near left auxiliary area since Saturday and new onset of loose stools and nausea since this morning.   Pt reports left chest/side pain is similar in nature to the pain she had back in June or July for which she was given a muscle relaxer for and she took one of those last night with improvement but is more concerned now with the abd symptoms that it is not musculoskeletal
